# Patient Record
Sex: Female | Born: 1990
Health system: Southern US, Community
[De-identification: ages and names within clinical notes are randomized; demographics above are authoritative.]

## PROBLEM LIST (undated history)

## (undated) DIAGNOSIS — J45909 Unspecified asthma, uncomplicated: Secondary | ICD-10-CM

## (undated) DIAGNOSIS — N809 Endometriosis, unspecified: Secondary | ICD-10-CM

## (undated) DIAGNOSIS — D649 Anemia, unspecified: Secondary | ICD-10-CM

---

## 2018-04-08 ENCOUNTER — Other Ambulatory Visit: Payer: Self-pay

## 2018-04-08 ENCOUNTER — Inpatient Hospital Stay (HOSPITAL_BASED_OUTPATIENT_CLINIC_OR_DEPARTMENT_OTHER)
Admission: AD | Admit: 2018-04-08 | Discharge: 2018-04-10 | DRG: 759 | Disposition: A | Discharge status: Home / Self Care | Payer: Self-pay | Source: Ambulatory Visit | Attending: Obstetrics and Gynecology | Admitting: Obstetrics and Gynecology

## 2018-04-08 ENCOUNTER — Emergency Department (HOSPITAL_BASED_OUTPATIENT_CLINIC_OR_DEPARTMENT_OTHER): Payer: Self-pay

## 2018-04-08 ENCOUNTER — Other Ambulatory Visit: Payer: Self-pay | Admitting: Obstetrics and Gynecology

## 2018-04-08 ENCOUNTER — Encounter (HOSPITAL_BASED_OUTPATIENT_CLINIC_OR_DEPARTMENT_OTHER): Payer: Self-pay

## 2018-04-08 DIAGNOSIS — A568 Sexually transmitted chlamydial infection of other sites: Secondary | ICD-10-CM | POA: Diagnosis present

## 2018-04-08 DIAGNOSIS — N730 Acute parametritis and pelvic cellulitis: Secondary | ICD-10-CM

## 2018-04-08 DIAGNOSIS — A749 Chlamydial infection, unspecified: Secondary | ICD-10-CM | POA: Diagnosis present

## 2018-04-08 DIAGNOSIS — N7093 Salpingitis and oophoritis, unspecified: Principal | ICD-10-CM | POA: Diagnosis present

## 2018-04-08 DIAGNOSIS — N946 Dysmenorrhea, unspecified: Secondary | ICD-10-CM | POA: Diagnosis present

## 2018-04-08 HISTORY — DX: Unspecified asthma, uncomplicated: J45.909

## 2018-04-08 HISTORY — DX: Endometriosis, unspecified: N80.9

## 2018-04-08 HISTORY — DX: Anemia, unspecified: D64.9

## 2018-04-08 LAB — CBC WITH DIFFERENTIAL/PLATELET
BASOS ABS: 0 10*3/uL (ref 0.0–0.1)
Basophils Relative: 0 %
EOS ABS: 0 10*3/uL (ref 0.0–0.7)
Eosinophils Relative: 0 %
HEMATOCRIT: 28.1 % — AB (ref 36.0–46.0)
Hemoglobin: 8.5 g/dL — ABNORMAL LOW (ref 12.0–15.0)
LYMPHS ABS: 1.2 10*3/uL (ref 0.7–4.0)
Lymphocytes Relative: 12 %
MCH: 22.4 pg — ABNORMAL LOW (ref 26.0–34.0)
MCHC: 30.2 g/dL (ref 30.0–36.0)
MCV: 74.1 fL — ABNORMAL LOW (ref 78.0–100.0)
MONOS PCT: 7 %
Monocytes Absolute: 0.7 10*3/uL (ref 0.1–1.0)
Neutro Abs: 7.7 10*3/uL (ref 1.7–7.7)
Neutrophils Relative %: 81 %
Platelets: 335 10*3/uL (ref 150–400)
RBC: 3.79 MIL/uL — AB (ref 3.87–5.11)
RDW: 17.9 % — AB (ref 11.5–15.5)
WBC: 9.6 10*3/uL (ref 4.0–10.5)

## 2018-04-08 LAB — COMPREHENSIVE METABOLIC PANEL
ALBUMIN: 3.4 g/dL — AB (ref 3.5–5.0)
ALK PHOS: 89 U/L (ref 38–126)
ALT: 19 U/L (ref 14–54)
AST: 24 U/L (ref 15–41)
Anion gap: 11 (ref 5–15)
BILIRUBIN TOTAL: 0.6 mg/dL (ref 0.3–1.2)
BUN: 10 mg/dL (ref 6–20)
CO2: 21 mmol/L — ABNORMAL LOW (ref 22–32)
Calcium: 8.6 mg/dL — ABNORMAL LOW (ref 8.9–10.3)
Chloride: 104 mmol/L (ref 101–111)
Creatinine, Ser: 0.97 mg/dL (ref 0.44–1.00)
GFR calc Af Amer: >60 mL/min (ref >60)
GFR calc non Af Amer: >60 mL/min (ref >60)
GLUCOSE: 113 mg/dL — AB (ref 65–99)
POTASSIUM: 3.6 mmol/L (ref 3.5–5.1)
Sodium: 136 mmol/L (ref 135–145)
TOTAL PROTEIN: 8.6 g/dL — AB (ref 6.5–8.1)

## 2018-04-08 LAB — URINALYSIS, ROUTINE W REFLEX MICROSCOPIC
Glucose, UA: NEGATIVE mg/dL
KETONES UR: NEGATIVE mg/dL
Nitrite: NEGATIVE
PROTEIN: 30 mg/dL — AB
Specific Gravity, Urine: >1.03 — ABNORMAL HIGH (ref 1.005–1.030)
pH: 6 (ref 5.0–8.0)

## 2018-04-08 LAB — URINALYSIS, MICROSCOPIC (REFLEX)

## 2018-04-08 LAB — WET PREP, GENITAL
SPERM: NONE SEEN
TRICH WET PREP: NONE SEEN
Yeast Wet Prep HPF POC: NONE SEEN

## 2018-04-08 LAB — GC/CHLAMYDIA PROBE AMP (~~LOC~~) NOT AT ARMC
Chlamydia: POSITIVE — AB
Neisseria Gonorrhea: NEGATIVE

## 2018-04-08 LAB — PREGNANCY, URINE: PREG TEST UR: NEGATIVE

## 2018-04-08 MED ORDER — PIPERACILLIN-TAZOBACTAM 3.375 G IVPB
3.3750 g | Freq: Three times a day (TID) | INTRAVENOUS | Status: DC
  Administered 2018-04-08 – 2018-04-10 (×7): 3.375 g via INTRAVENOUS
  Filled 2018-04-08 (×8): qty 50

## 2018-04-08 MED ORDER — DOXYCYCLINE HYCLATE 100 MG IV SOLR
INTRAVENOUS | Status: AC
  Filled 2018-04-08: qty 100

## 2018-04-08 MED ORDER — SODIUM CHLORIDE 0.9 % IV SOLN: 250.0000 mL | INTRAVENOUS | Status: DC | PRN

## 2018-04-08 MED ORDER — SODIUM CHLORIDE 0.9 % IV SOLN
100.0000 mg | Freq: Once | INTRAVENOUS | Status: AC
  Administered 2018-04-08: 100 mg via INTRAVENOUS
  Filled 2018-04-08: qty 100

## 2018-04-08 MED ORDER — SODIUM CHLORIDE 0.9 % IV SOLN
1.0000 g | Freq: Once | INTRAVENOUS | Status: DC
  Filled 2018-04-08: qty 10

## 2018-04-08 MED ORDER — PRENATAL MULTIVITAMIN CH: 1.0000 | ORAL_TABLET | Freq: Every day | ORAL | Status: DC

## 2018-04-08 MED ORDER — FENTANYL CITRATE (PF) 100 MCG/2ML IJ SOLN
50.0000 ug | Freq: Once | INTRAMUSCULAR | Status: AC
  Administered 2018-04-08: 50 ug via INTRAVENOUS
  Filled 2018-04-08: qty 2

## 2018-04-08 MED ORDER — ACETAMINOPHEN 500 MG PO TABS
1000.0000 mg | ORAL_TABLET | Freq: Once | ORAL | Status: AC
  Administered 2018-04-08: 1000 mg via ORAL
  Filled 2018-04-08: qty 2

## 2018-04-08 MED ORDER — FENTANYL CITRATE (PF) 100 MCG/2ML IJ SOLN
50.0000 ug | Freq: Once | INTRAMUSCULAR | Status: AC
Start: 2018-04-08 — End: 2018-04-08
  Administered 2018-04-08: 50 ug via INTRAVENOUS
  Filled 2018-04-08: qty 2

## 2018-04-08 MED ORDER — PROMETHAZINE HCL 25 MG/ML IJ SOLN
25.0000 mg | Freq: Four times a day (QID) | INTRAMUSCULAR | Status: DC | PRN
  Administered 2018-04-08 (×2): 25 mg via INTRAVENOUS
  Filled 2018-04-08 (×2): qty 1

## 2018-04-08 MED ORDER — DOXYCYCLINE HYCLATE 100 MG PO TABS: 100.0000 mg | ORAL_TABLET | Freq: Once | ORAL | Status: DC

## 2018-04-08 MED ORDER — METRONIDAZOLE IN NACL 5-0.79 MG/ML-% IV SOLN
500.0000 mg | Freq: Once | INTRAVENOUS | Status: AC
  Administered 2018-04-08: 500 mg via INTRAVENOUS
  Filled 2018-04-08: qty 100

## 2018-04-08 MED ORDER — SODIUM CHLORIDE 0.9 % IV SOLN
2.0000 g | Freq: Once | INTRAVENOUS | Status: AC
  Administered 2018-04-08: 2 g via INTRAVENOUS
  Filled 2018-04-08: qty 2

## 2018-04-08 MED ORDER — METRONIDAZOLE 500 MG PO TABS
500.0000 mg | ORAL_TABLET | Freq: Two times a day (BID) | ORAL | Status: DC
  Administered 2018-04-09 (×2): 500 mg via ORAL
  Filled 2018-04-08 (×3): qty 1

## 2018-04-08 MED ORDER — ONDANSETRON HCL 4 MG/2ML IJ SOLN
4.0000 mg | Freq: Once | INTRAMUSCULAR | Status: AC
  Administered 2018-04-08: 4 mg via INTRAVENOUS
  Filled 2018-04-08: qty 2

## 2018-04-08 MED ORDER — SODIUM CHLORIDE 0.9% FLUSH: 3.0000 mL | INTRAVENOUS | Status: DC | PRN

## 2018-04-08 MED ORDER — IBUPROFEN 600 MG PO TABS: 600.0000 mg | ORAL_TABLET | Freq: Four times a day (QID) | ORAL | Status: DC | PRN

## 2018-04-08 MED ORDER — SODIUM CHLORIDE 0.9% FLUSH
3.0000 mL | Freq: Two times a day (BID) | INTRAVENOUS | Status: DC
  Administered 2018-04-09: 3 mL via INTRAVENOUS

## 2018-04-08 MED ORDER — IOPAMIDOL (ISOVUE-300) INJECTION 61%
100.0000 mL | Freq: Once | INTRAVENOUS | Status: AC | PRN
  Administered 2018-04-08: 100 mL via INTRAVENOUS

## 2018-04-08 MED ORDER — SODIUM CHLORIDE 0.9 % IV BOLUS
1000.0000 mL | Freq: Once | INTRAVENOUS | Status: AC
  Administered 2018-04-08: 1000 mL via INTRAVENOUS

## 2018-04-08 MED ORDER — ONDANSETRON 4 MG PO TBDP
4.0000 mg | ORAL_TABLET | Freq: Four times a day (QID) | ORAL | Status: DC | PRN
  Filled 2018-04-08: qty 1

## 2018-04-08 MED ORDER — CEFTRIAXONE SODIUM 250 MG IJ SOLR: 250.0000 mg | Freq: Once | INTRAMUSCULAR | Status: DC

## 2018-04-08 MED ORDER — SODIUM CHLORIDE 0.9 % IV SOLN
INTRAVENOUS | Status: DC
  Administered 2018-04-08: 125 mL/h via INTRAVENOUS
  Administered 2018-04-09: 18:00:00 via INTRAVENOUS
  Administered 2018-04-10: 125 mL/h via INTRAVENOUS

## 2018-04-08 MED ORDER — ONDANSETRON HCL 4 MG/2ML IJ SOLN
4.0000 mg | Freq: Four times a day (QID) | INTRAMUSCULAR | Status: DC | PRN
  Administered 2018-04-08 – 2018-04-09 (×3): 4 mg via INTRAVENOUS
  Filled 2018-04-08 (×3): qty 2

## 2018-04-08 MED ORDER — FENTANYL CITRATE (PF) 100 MCG/2ML IJ SOLN
50.0000 ug | INTRAMUSCULAR | Status: DC | PRN
  Administered 2018-04-08 – 2018-04-09 (×2): 50 ug via INTRAVENOUS
  Filled 2018-04-08 (×2): qty 2

## 2018-04-08 MED ORDER — HYDROCODONE-ACETAMINOPHEN 5-325 MG PO TABS
1.0000 | ORAL_TABLET | ORAL | Status: DC | PRN
  Administered 2018-04-09: 2 via ORAL
  Filled 2018-04-08: qty 2

## 2018-04-08 NOTE — ED Triage Notes (Signed)
Pt was seen at ED in [place] and dx'd with UTI, states she has had worsening right flank pain with fever despite taking her keflex as prescribed, pt has not taken anything for fever or pain today

## 2018-04-08 NOTE — ED Provider Notes (Addendum)
TIME SEEN: 1:07 AM  CHIEF COMPLAINT: Abdominal pain  HPI: Patient is a 27 year old female with history of endometriosis, anemia who presents to the emergency department with complaints of diffuse abdominal pain that has been ongoing for 3 days.  Was seen at an emergency department in [place] and diagnosed with urinary tract infection and started on Keflex.  States pain has progressively gotten worse and she has had fever.  Last menstrual period was the end of April.  No history of STDs or pregnancies.  No nausea, vomiting or diarrhea.  No history of abdominal surgery.  Reports abnormal vaginal bleeding and discharge recently as well as hematuria.  ROS: See HPI Constitutional:  fever  Eyes: no drainage  ENT: no runny nose   Cardiovascular:  no chest pain  Resp: no SOB  GI: no vomiting GU: no dysuria Integumentary: no rash  Allergy: no hives  Musculoskeletal: no leg swelling  Neurological: no slurred speech ROS otherwise negative  PAST MEDICAL HISTORY/PAST SURGICAL HISTORY:  Past Medical History:  Diagnosis Date  . Anemia   . Asthma   . Endometriosis     MEDICATIONS:  Prior to Admission medications   Not on File    ALLERGIES:  No Known Allergies  SOCIAL HISTORY:  Social History   Tobacco Use  . Smoking status: Not on file  Substance Use Topics  . Alcohol use: Not on file    FAMILY HISTORY: No family history on file.  EXAM: BP 102/62 (BP Location: Right Arm)   Pulse 95   Temp 100.2 F (37.9 C) (Oral)   Resp 18   Ht  (1.651 m)   Wt 62.1 kg (137 lb)   LMP 03/19/2018   SpO2 100%   BMI 22.80 kg/m  CONSTITUTIONAL: Alert and oriented and responds appropriately to questions.  Appears uncomfortable, nontoxic-appearing HEAD: Normocephalic EYES: Conjunctivae clear, pupils appear equal, EOMI ENT: normal nose; moist mucous membranes NECK: Supple, no meningismus, no nuchal rigidity, no LAD  CARD: RRR; S1 and S2 appreciated; no murmurs, no clicks, no rubs, no  gallops RESP: Normal chest excursion without splinting or tachypnea; breath sounds clear and equal bilaterally; no wheezes, no rhonchi, no rales, no hypoxia or respiratory distress, speaking full sentences ABD/GI: Normal bowel sounds; non-distended; soft, used to tender throughout the abdomen, no rebound, no guarding, no peritoneal signs, no hepatosplenomegaly GU:  Normal external genitalia. No lesions, rashes noted. Patient has no vaginal bleeding on exam.  Large amount of thin yellow vaginal discharge coming from the cervical os and in the posterior vault.  Mild amount of left adnexal tenderness without fullness or mass.  No right adnexal tenderness, mass or fullness, no cervical motion tenderness. Cervix is not appear friable.  Cervix is closed.  Chaperone present for exam. BACK:  The back appears normal and is non-tender to palpation, there is no CVA tenderness EXT: Normal ROM in all joints; non-tender to palpation; no edema; normal capillary refill; no cyanosis, no calf tenderness or swelling    SKIN: Normal color for age and race; warm; no rash NEURO: Moves all extremities equally PSYCH: The patient's mood and manner are appropriate. Grooming and personal hygiene are appropriate.  MEDICAL DECISION MAKING: Patient here with fever, diffuse abdominal pain.  Differential is large including appendicitis, cholecystitis, pancreatitis, bowel obstruction, colitis, UTI, pyelonephritis, PID, TOA.  Pelvic exam performed which shows large amount of yellow discharge some mild left adnexal tenderness but no cervical motion tenderness.  Labs, urine, pelvic cultures, CT of her abdomen  pelvis pending.  Will treat symptoms with fentanyl, Zofran.  ED PROGRESS: CT scan concerning for PID with bilateral tubo-ovarian abscesses.  She does not have a local OB/GYN.  Will treat with IV cefoxitin, doxycycline and Flagyl.  I will discuss with OB/GYN on-call.  With partner out of the room, patient does report that she was  last sexually active with a female partner approximately 2 months ago.  Currently only sexually active with a female partner.  Again denies history of STDs.   4:17 AM  D/w Dr. Jolayne Panther at [facility].  She agrees with transport to [facility] for admission to the third floor.  She will place admission orders.  Appreciate her help. Patient and partner have been updated with this plan.  We will keep her n.p.o. at this time.   I reviewed all nursing notes, vitals, pertinent previous records, EKGs, lab and urine results, imaging (as available).        Ward, Layla Maw, DO 04/08/18 0420    Ward, Layla Maw, DO 04/08/18 4098

## 2018-04-08 NOTE — ED Notes (Signed)
Called [facility] (kim) for patient transport.

## 2018-04-08 NOTE — H&P (Signed)
Samantha Fowler is an 27 y.o. female G0 with LMP 03/19/2018 transferred from [facility] for inpatient management of bilateral TOA. Patient reports being diagnosed a UTI while in [place] and started a course of antibiotics. Her symptoms never improved. She reports worsening abdominal pain over the past 3 days. She reports fevers at home. She reports excessive vaginal discharge. She is currently sexually active in a same sex relationship. She had intercourse with a female partner 2 months ago. She is not currently on any birth control. She reports a history of endometriosis previously managed with contraception. She states that she is ready to get started on a birth control which suppresses her menses in the hopes of resolving her monthly dysmenorrhea.  Pertinent Gynecological History: Menses: regular every month without intermenstrual spotting Contraception: none DES exposure: denies Blood transfusions: none Sexually transmitted diseases: no past history Previous GYN Procedures: n/a  OB History: G0, P0   Menstrual History: Patient's last menstrual period was 03/19/2018.    Past Medical History:  Diagnosis Date  . Anemia   . Asthma   . Endometriosis     History reviewed. No pertinent surgical history.  No family history on file.  Social History:  has no tobacco, alcohol, and drug history on file.  Allergies: No Known Allergies  No medications prior to admission.    ROS  See pertinent in HPI  Blood pressure (!) 107/59, pulse 100, temperature 97.9 F (36.6 C), temperature source Oral, resp. rate 20, height  (1.651 m), weight 137 lb (62.1 kg), last menstrual period 03/19/2018, SpO2 100 %. Physical Exam GENERAL: Well-developed, well-nourished female in no acute distress.  HEENT: Normocephalic, atraumatic. Sclerae anicteric.  NECK: Supple. Normal thyroid.  LUNGS: Clear to auscultation bilaterally.  HEART: Regular rate and rhythm. ABDOMEN: Soft, diffusely tender,  nondistended.  PELVIC: Normal external female genitalia. Vagina is pink and rugated.  Copious amount of creamy discharge. Normal appearing cervix. Uterus is normal in size. Diffuse lower abdominal tenderness EXTREMITIES: No cyanosis, clubbing, or edema, 2+ distal pulses.  Results for orders placed or performed during the hospital encounter of 04/08/18 (from the past 24 hour(s))  Pregnancy, urine     Status: None   Collection Time: 04/08/18 12:59 AM  Result Value Ref Range   Preg Test, Ur NEGATIVE NEGATIVE  Urinalysis, Routine w reflex microscopic     Status: Abnormal   Collection Time: 04/08/18 12:59 AM  Result Value Ref Range   Color, Urine YELLOW YELLOW   APPearance CLOUDY (A) CLEAR   Specific Gravity, Urine >1.030 (H) 1.005 - 1.030   pH 6.0 5.0 - 8.0   Glucose, UA NEGATIVE NEGATIVE mg/dL   Hgb urine dipstick TRACE (A) NEGATIVE   Bilirubin Urine SMALL (A) NEGATIVE   Ketones, ur NEGATIVE NEGATIVE mg/dL   Protein, ur 30 (A) NEGATIVE mg/dL   Nitrite NEGATIVE NEGATIVE   Leukocytes, UA TRACE (A) NEGATIVE  Urinalysis, Microscopic (reflex)     Status: Abnormal   Collection Time: 04/08/18 12:59 AM  Result Value Ref Range   RBC / HPF 0-5 0 - 5 RBC/hpf   WBC, UA 21-50 0 - 5 WBC/hpf   Bacteria, UA FEW (A) NONE SEEN   Squamous Epithelial / LPF 0-5 0 - 5   Non Squamous Epithelial PRESENT (A) NONE SEEN   Mucus PRESENT   CBC with Differential     Status: Abnormal   Collection Time: 04/08/18  1:10 AM  Result Value Ref Range   WBC 9.6 4.0 -  10.5 K/uL   RBC 3.79 (L) 3.87 - 5.11 MIL/uL   Hemoglobin 8.5 (L) 12.0 - 15.0 g/dL   HCT 16.1 (L) 09.6 - 04.5 %   MCV 74.1 (L) 78.0 - 100.0 fL   MCH 22.4 (L) 26.0 - 34.0 pg   MCHC 30.2 30.0 - 36.0 g/dL   RDW 40.9 (H) 81.1 - 91.4 %   Platelets 335 150 - 400 K/uL   Neutrophils Relative % 81 %   Lymphocytes Relative 12 %   Monocytes Relative 7 %   Eosinophils Relative 0 %   Basophils Relative 0 %   Neutro Abs 7.7 1.7 - 7.7 K/uL   Lymphs Abs 1.2 0.7  - 4.0 K/uL   Monocytes Absolute 0.7 0.1 - 1.0 K/uL   Eosinophils Absolute 0.0 0.0 - 0.7 K/uL   Basophils Absolute 0.0 0.0 - 0.1 K/uL   RBC Morphology ELLIPTOCYTES   Comprehensive metabolic panel     Status: Abnormal   Collection Time: 04/08/18  1:10 AM  Result Value Ref Range   Sodium 136 135 - 145 mmol/L   Potassium 3.6 3.5 - 5.1 mmol/L   Chloride 104 101 - 111 mmol/L   CO2 21 (L) 22 - 32 mmol/L   Glucose, Bld 113 (H) 65 - 99 mg/dL   BUN 10 6 - 20 mg/dL   Creatinine, Ser 7.82 0.44 - 1.00 mg/dL   Calcium 8.6 (L) 8.9 - 10.3 mg/dL   Total Protein 8.6 (H) 6.5 - 8.1 g/dL   Albumin 3.4 (L) 3.5 - 5.0 g/dL   AST 24 15 - 41 U/L   ALT 19 14 - 54 U/L   Alkaline Phosphatase 89 38 - 126 U/L   Total Bilirubin 0.6 0.3 - 1.2 mg/dL   GFR calc non Af Amer >60 >60 mL/min   GFR calc Af Amer >60 >60 mL/min   Anion gap 11 5 - 15  Wet prep, genital     Status: Abnormal   Collection Time: 04/08/18  2:46 AM  Result Value Ref Range   Yeast Wet Prep HPF POC NONE SEEN NONE SEEN   Trich, Wet Prep NONE SEEN NONE SEEN   Clue Cells Wet Prep HPF POC PRESENT (A) NONE SEEN   WBC, Wet Prep HPF POC MANY (A) NONE SEEN   Sperm NONE SEEN     Ct Abdomen Pelvis W Contrast  Result Date: 04/08/2018 CLINICAL DATA:  27 year old female with right flank pain. Diagnosed with UTI and started antibiotic with no symptom relief. EXAM: CT ABDOMEN AND PELVIS WITH CONTRAST TECHNIQUE: Multidetector CT imaging of the abdomen and pelvis was performed using the standard protocol following bolus administration of intravenous contrast. CONTRAST:  ISOVUE-300 IOPAMIDOL (ISOVUE-300) INJECTION 61% COMPARISON:  None. FINDINGS: Lower chest: The visualized lung bases are clear. No intra-abdominal free air. There is diffuse mesenteric edema and small amount of free fluid within the pelvis. Hepatobiliary: No focal liver abnormality is seen. Small noncalcified stone or sludge may be present within the gallbladder. No pericholecystic fluid. No  dilatation of the biliary ducts. Pancreas: Unremarkable. No pancreatic ductal dilatation or surrounding inflammatory changes. Spleen: Normal in size without focal abnormality. Adrenals/Urinary Tract: Adrenal glands are unremarkable. Kidneys are normal, without renal calculi, focal lesion, or hydronephrosis. Bladder is unremarkable. Stomach/Bowel: There is inflammatory changes and thickening of multiple loops of small bowel in the mid to lower ab. There is dilatation of the loop of bowel proximal to this segment which measures up to 3.7 cm. Oral contrast is  seen in the proximal small bowel loops. The colon is unremarkable. The appendix is normal. Vascular/Lymphatic: No significant vascular findings are present. No enlarged abdominal or pelvic lymph nodes. Reproductive: The uterus is anteverted and grossly unremarkable. There are bilateral adnexal dilated tubal structures with complex content measuring up to 6 x 3 cm on the right. There is diffuse inflammatory changes and edema of the pelvic fat. Other: None Musculoskeletal: No acute or significant osseous findings. IMPRESSION: 1. Inflammatory changes of the pelvis with complex dilated tubular structures within the adnexa. Findings suspicious for pelvic inflammatory disease and possible tubo-ovarian abscess. Correlation with clinical exam and further evaluation with pelvic ultrasound recommended. 2. Inflammatory changes of the mid small bowel loops with associated ileus. This inflammatory changes may be secondary to inflammation of the pelvis. Enteritis as the primary cause of the inflammatory changes within the abdomen pelvis is favored less likely but not entirely excluded clinical correlation is recommended. The appendix is normal. Electronically Signed   By: Elgie Collard M.D.   On: 04/08/2018 03:55    Assessment/Plan: 27 yo G0 with bilateral TOA - IV antibiotics - pain management prn - Consider consult with interventional radiology  Jennilyn Esteve 04/08/2018, 6:02 AM

## 2018-04-09 DIAGNOSIS — A749 Chlamydial infection, unspecified: Secondary | ICD-10-CM | POA: Diagnosis present

## 2018-04-09 LAB — CBC WITH DIFFERENTIAL/PLATELET
BASOS ABS: 0 10*3/uL (ref 0.0–0.1)
Basophils Relative: 0 %
EOS ABS: 0 10*3/uL (ref 0.0–0.7)
Eosinophils Relative: 0 %
HCT: 27.6 % — ABNORMAL LOW (ref 36.0–46.0)
Hemoglobin: 7.9 g/dL — ABNORMAL LOW (ref 12.0–15.0)
LYMPHS ABS: 1.8 10*3/uL (ref 0.7–4.0)
Lymphocytes Relative: 19 %
MCH: 22.1 pg — AB (ref 26.0–34.0)
MCHC: 28.6 g/dL — ABNORMAL LOW (ref 30.0–36.0)
MCV: 77.1 fL — ABNORMAL LOW (ref 78.0–100.0)
MONO ABS: 0.2 10*3/uL (ref 0.1–1.0)
MONOS PCT: 2 %
NEUTROS ABS: 7.3 10*3/uL (ref 1.7–7.7)
NEUTROS PCT: 79 %
Platelets: 349 10*3/uL (ref 150–400)
RBC: 3.58 MIL/uL — AB (ref 3.87–5.11)
RDW: 18.2 % — ABNORMAL HIGH (ref 11.5–15.5)
WBC: 9.3 10*3/uL (ref 4.0–10.5)

## 2018-04-09 LAB — URINE CULTURE: CULTURE: NO GROWTH

## 2018-04-09 LAB — HEPATITIS B SURFACE ANTIGEN: HEP B S AG: NEGATIVE

## 2018-04-09 MED ORDER — AZITHROMYCIN 250 MG PO TABS
1000.0000 mg | ORAL_TABLET | Freq: Once | ORAL | Status: AC
  Administered 2018-04-09: 1000 mg via ORAL
  Filled 2018-04-09: qty 4

## 2018-04-09 NOTE — Progress Notes (Signed)
[facility]ulty [facility] OB/GYN Attending Note  Subjective:  Patient reports reduced abdominal pain, reduced nausea. Able to tolerate some food. No other symptoms.  Admitted on 04/08/2018 for TOA (tubo-ovarian abscess).    Objective:  Blood pressure 111/61, pulse (!) 50, temperature 98.4 F (36.9 C), resp. rate 16, height '5\' 5"'  (1.651 m), weight 137 lb (62.1 kg), last menstrual period 03/19/2018, SpO2 100 %. Patient Vitals for the past 24 hrs:  BP Temp Temp src Pulse Resp SpO2  04/09/18 0404 111/61 98.4 F (36.9 C) - (!) 50 16 100 %  04/09/18 0000 114/65 98 F (36.7 C) - (!) 54 18 100 %  04/08/18 1956 120/66 98.8 F (37.1 C) - (!) 57 16 100 %  04/08/18 1550 114/63 99.4 F (37.4 C) Oral 65 16 100 %  04/08/18 1146 (!) 100/57 - - (!) 48 - -  04/08/18 1132 (!) 73/56 98.5 F (36.9 C) Oral (!) 53 16 100 %   Gen: NAD HENT: Normocephalic, atraumatic Lungs: Normal respiratory effort Heart: Regular rate noted Abdomen: soft, mild TTP in lower abdomen Cervix: Deferred Ext: 2+ DTRs, no edema, no cyanosis, negative Homan's sign  Results for orders placed or performed during the hospital encounter of 04/08/18 (from the past 72 hour(s))  GC/Chlamydia probe amp ([place])not at [facility]     Status: Abnormal   Collection Time: 04/08/18 12:00 AM  Result Value Ref Range   Chlamydia **POSITIVE** (A)     Comment: Normal Reference Range - Negative   Neisseria gonorrhea Negative     Comment: Normal Reference Range - Negative  Pregnancy, urine     Status: None   Collection Time: 04/08/18 12:59 AM  Result Value Ref Range   Preg Test, Ur NEGATIVE NEGATIVE    Comment:        THE SENSITIVITY OF THIS METHODOLOGY IS >20 mIU/mL. Performed at [facility], [address] [place], [place] [address]   Urinalysis, Routine w reflex microscopic     Status: Abnormal   Collection Time: 04/08/18 12:59 AM  Result Value Ref Range   Color, Urine YELLOW YELLOW   APPearance CLOUDY (A) CLEAR   Specific  Gravity, Urine >1.030 (H) 1.005 - 1.030   pH 6.0 5.0 - 8.0   Glucose, UA NEGATIVE NEGATIVE mg/dL   Hgb urine dipstick TRACE (A) NEGATIVE   Bilirubin Urine SMALL (A) NEGATIVE   Ketones, ur NEGATIVE NEGATIVE mg/dL   Protein, ur 30 (A) NEGATIVE mg/dL   Nitrite NEGATIVE NEGATIVE   Leukocytes, UA TRACE (A) NEGATIVE    Comment: Performed at [facility], [address] [place], [place] [address]  Urine culture     Status: None   Collection Time: 04/08/18 12:59 AM  Result Value Ref Range   Specimen Description      URINE, RANDOM Performed at [facility], [address] [place], [place] [address]    Special Requests      NONE Performed at [facility], [address] [place], [place] [address]    Culture      NO GROWTH Performed at [facility], [place] [address], [place], [place] [address]    Report Status 04/09/2018 FINAL   Urinalysis, Microscopic (reflex)     Status: Abnormal   Collection Time: 04/08/18 12:59 AM  Result Value Ref Range   RBC / HPF 0-5 0 - 5 RBC/hpf   WBC, UA 21-50 0 - 5 WBC/hpf  Bacteria, UA FEW (A) NONE SEEN   Squamous Epithelial / LPF 0-5 0 - 5   Non Squamous Epithelial PRESENT (A) NONE SEEN   Mucus PRESENT     Comment: Performed at [facility], [address] [place], [place] [address]  CBC with Differential     Status: Abnormal   Collection Time: 04/08/18  1:10 AM  Result Value Ref Range   WBC 9.6 4.0 - 10.5 K/uL   RBC 3.79 (L) 3.87 - 5.11 MIL/uL   Hemoglobin 8.5 (L) 12.0 - 15.0 g/dL   HCT 28.1 (L) 36.0 - 46.0 %   MCV 74.1 (L) 78.0 - 100.0 fL   MCH 22.4 (L) 26.0 - 34.0 pg   MCHC 30.2 30.0 - 36.0 g/dL   RDW 17.9 (H) 11.5 - 15.5 %   Platelets 335 150 - 400 K/uL   Neutrophils Relative % 81 %   Lymphocytes Relative 12 %   Monocytes Relative 7 %   Eosinophils Relative 0 %   Basophils Relative 0 %   Neutro Abs 7.7 1.7 - 7.7 K/uL   Lymphs Abs 1.2 0.7 - 4.0 K/uL   Monocytes Absolute 0.7 0.1 - 1.0 K/uL    Eosinophils Absolute 0.0 0.0 - 0.7 K/uL   Basophils Absolute 0.0 0.0 - 0.1 K/uL   RBC Morphology ELLIPTOCYTES     Comment: POLYCHROMASIA PRESENT Performed at [facility], [address] [place], [place] [address]   Comprehensive metabolic panel     Status: Abnormal   Collection Time: 04/08/18  1:10 AM  Result Value Ref Range   Sodium 136 135 - 145 mmol/L   Potassium 3.6 3.5 - 5.1 mmol/L   Chloride 104 101 - 111 mmol/L   CO2 21 (L) 22 - 32 mmol/L   Glucose, Bld 113 (H) 65 - 99 mg/dL   BUN 10 6 - 20 mg/dL   Creatinine, Ser 0.97 0.44 - 1.00 mg/dL   Calcium 8.6 (L) 8.9 - 10.3 mg/dL   Total Protein 8.6 (H) 6.5 - 8.1 g/dL   Albumin 3.4 (L) 3.5 - 5.0 g/dL   AST 24 15 - 41 U/L   ALT 19 14 - 54 U/L   Alkaline Phosphatase 89 38 - 126 U/L   Total Bilirubin 0.6 0.3 - 1.2 mg/dL   GFR calc non Af Amer >60 >60 mL/min   GFR calc Af Amer >60 >60 mL/min    Comment: (NOTE) The eGFR has been calculated using the CKD EPI equation. This calculation has not been validated in all clinical situations. eGFR's persistently <60 mL/min signify possible Chronic Kidney Disease.    Anion gap 11 5 - 15    Comment: Performed at [facility], [address] [place], [place] [address]  Wet prep, genital     Status: Abnormal   Collection Time: 04/08/18  2:46 AM  Result Value Ref Range   Yeast Wet Prep HPF POC NONE SEEN NONE SEEN   Trich, Wet Prep NONE SEEN NONE SEEN   Clue Cells Wet Prep HPF POC PRESENT (A) NONE SEEN   WBC, Wet Prep HPF POC MANY (A) NONE SEEN   Sperm NONE SEEN     Comment: Performed at [facility], [address] [place], [place] [address]    Assessment & Plan:  Principal Problem:   TOA (tubo-ovarian abscess) Active Problems:   Chlamydia  Positive chlamydia result discussed with patient.  Recommended testing for other STIs (  this was ordered), also needs to let partner(s) know so the partner(s) can get testing and treatment. Patient and sex  partner(s) should abstain from unprotected sexual activity for seven days after everyone receives appropriate treatment.  Azithromycin 1000 mg po x 1 was prescribed for patient.  Patient will need to to be tested after 4 weeks after treatment for test of cure. Continue Zosyn for TOA treatment; will have this for at least 48 hours Consider discharge to home tomorrow on oral regimen if remain stable and afebrile, and if pain is still under control Routine floor care   Verita Schneiders, MD, [place], [facility] for [place]

## 2018-04-10 ENCOUNTER — Encounter (HOSPITAL_COMMUNITY): Payer: Self-pay | Admitting: Obstetrics and Gynecology

## 2018-04-10 DIAGNOSIS — N730 Acute parametritis and pelvic cellulitis: Secondary | ICD-10-CM | POA: Diagnosis present

## 2018-04-10 DIAGNOSIS — N7093 Salpingitis and oophoritis, unspecified: Principal | ICD-10-CM

## 2018-04-10 LAB — RPR: RPR Ser Ql: NONREACTIVE

## 2018-04-10 LAB — HIV ANTIBODY (ROUTINE TESTING W REFLEX): HIV Screen 4th Generation wRfx: NONREACTIVE

## 2018-04-10 LAB — HEPATITIS C ANTIBODY (REFLEX): HCV Ab: <0.1 s/co ratio (ref 0.0–0.9)

## 2018-04-10 LAB — HCV COMMENT:

## 2018-04-10 MED ORDER — IBUPROFEN 600 MG PO TABS: 600.0000 mg | ORAL_TABLET | Freq: Four times a day (QID) | ORAL | 0 refills | Status: DC | PRN

## 2018-04-10 MED ORDER — METRONIDAZOLE 500 MG PO TABS: 500.0000 mg | ORAL_TABLET | Freq: Two times a day (BID) | ORAL | 0 refills | Status: DC

## 2018-04-10 MED ORDER — ONDANSETRON 4 MG PO TBDP: 4.0000 mg | ORAL_TABLET | Freq: Three times a day (TID) | ORAL | 0 refills | Status: DC | PRN

## 2018-04-10 MED ORDER — DOXYCYCLINE HYCLATE 100 MG PO CAPS: 100.0000 mg | ORAL_CAPSULE | Freq: Two times a day (BID) | ORAL | 0 refills | Status: DC

## 2018-04-10 NOTE — Discharge Summary (Signed)
Physician Discharge Summary  Patient ID: Samantha Fowler MRN: [id number] DOB/AGE: [birth year] 26 y.o.  Admit date: 04/08/2018 Discharge date: 04/10/2018  Admission Diagnoses: tubo-ovarian abscess  Discharge Diagnoses:  Principal Problem:   TOA (tubo-ovarian abscess) Active Problems:   Chlamydia   PID (acute pelvic inflammatory disease)   Discharged Condition: good  Hospital Course: Please see HPI dated 04/08/2018 for full details. Briefly, this is a 27 y.o. G0 female admitted for tubo-ovarian abscess. She received 48 hours of zosyn and PO flagyl. She was never febrile. Genital cultures positive for chlamydia in hospital for which she was treated and counseled about partners needing treatment. By 48 hours of IV antibiotics, she was feeling much much better and desiring to go home. Very minimal tenderness on exam. She was discharged home HD#3 in good condition.  I reviewed importance of getting treatment for partners and refraining from sexual activity until all partners have been treated. I also reviewed the importance of compliance with antibiotics, importance of taking full course and need to return to hospital with any worsening issues, patient verbalizes understanding. She will f/u in office in 2 weeks.  Medical history significant for n/a  Physical exam  Vitals:   04/09/18 1941 04/09/18 2343 04/10/18 0520 04/10/18 0727  BP: (!) 116/55 (!) 92/45 (!) 104/55   Pulse: (!) 55 (!) 54 (!) 46 (!) 49  Resp: Temp: 98.6 F (37 C) 98.6 F (37 C) 98.9 F (37.2 C) 98.2 F (36.8 C)  TempSrc: Oral Oral Oral Oral  SpO2: 100% 100% 100% 100%  Weight:      Height:       BP (!) 104/55 (BP Location: Left Arm)   Pulse (!) 49   Temp 98.2 F (36.8 C) (Oral)   Resp 16   Ht  (1.651 m)   Wt 137 lb (62.1 kg)   LMP 03/19/2018   SpO2 100%   BMI 22.80 kg/m  CONSTITUTIONAL: Well-developed, well-nourished female in no acute distress.  SKIN: Skin is warm and dry. No rash noted. Not  diaphoretic. No erythema. No pallor. NEUROLOGIC: Alert and oriented to person, place, and time. Normal reflexes, muscle tone coordination. No cranial nerve deficit noted. PSYCHIATRIC: Normal mood and affect. Normal behavior. Normal judgment and thought content. CARDIOVASCULAR: Normal heart rate noted, regular rhythm RESPIRATORY: Clear to auscultation bilaterally. Effort and breath sounds normal, no problems with respiration noted. ABDOMEN: Soft, normal bowel sounds, no distention noted.  No tenderness, rebound or guarding.  PELVIC: deferred MUSCULOSKELETAL: Normal range of motion. No tenderness.  No cyanosis, clubbing, or edema.    Labs: Lab Results  Component Value Date   WBC 9.3 04/09/2018   HGB 7.9 (L) 04/09/2018   HCT 27.6 (L) 04/09/2018   MCV 77.1 (L) 04/09/2018   PLT 349 04/09/2018   CMP Latest Ref Rng & Units 04/08/2018  Glucose 65 - 99 mg/dL 578(I)  BUN 6 - 20 mg/dL 10  Creatinine 6.96 - 2.95 mg/dL 2.84  Sodium 132 - 440 mmol/L 136  Potassium 3.5 - 5.1 mmol/L 3.6  Chloride 101 - 111 mmol/L 104  CO2 22 - 32 mmol/L 21(L)  Calcium 8.9 - 10.3 mg/dL 1.0(U)  Total Protein 6.5 - 8.1 g/dL 7.2(Z)  Total Bilirubin 0.3 - 1.2 mg/dL 0.6  Alkaline Phos 38 - 126 U/L 89  AST 15 - 41 U/L 24  ALT 14 - 54 U/L 19      Disposition: Discharge disposition: 01-Home or Self Care  Discharge Instructions    Call MD for:  difficulty breathing, headache or visual disturbances   Complete by:  As directed    Call MD for:  persistant nausea and vomiting   Complete by:  As directed    Call MD for:  redness, tenderness, or signs of infection (pain, swelling, redness, odor or green/yellow discharge around incision site)   Complete by:  As directed    Call MD for:  severe uncontrolled pain   Complete by:  As directed    Call MD for:  temperature >100.4   Complete by:  As directed    Diet - low sodium heart healthy   Complete by:  As directed    Increase activity slowly   Complete by:   As directed      An After Visit Summary was printed and given to the patient. Allergies as of 04/10/2018   No Known Allergies     Medication List    STOP taking these medications   cephALEXin 500 MG capsule Commonly known as:  KEFLEX     TAKE these medications   doxycycline 100 MG capsule Commonly known as:  VIBRAMYCIN Take 1 capsule (100 mg total) by mouth 2 (two) times daily.   ibuprofen 600 MG tablet Commonly known as:  ADVIL,MOTRIN Take 1 tablet (600 mg total) by mouth every 6 (six) hours as needed (mild pain).   metroNIDAZOLE 500 MG tablet Commonly known as:  FLAGYL Take 1 tablet (500 mg total) by mouth 2 (two) times daily.   ondansetron 4 MG disintegrating tablet Commonly known as:  ZOFRAN ODT Take 1 tablet (4 mg total) by mouth every 8 (eight) hours as needed for nausea or vomiting.      Follow-up Information    [facility]. Schedule an appointment as soon as possible for a visit in 2 week(s).   Specialty:  Obstetrics and Gynecology Why:  for follow up  Contact information: [address] [place] [address] [phone]          Signed: Conan Bowens 04/10/2018, 7:29 AM

## 2018-04-10 NOTE — Discharge Instructions (Signed)
Pelvic Inflammatory Disease Pelvic inflammatory disease (PID) refers to an infection in some or all of the female organs. The infection can be in the uterus, ovaries, fallopian tubes, or the surrounding tissues in the pelvis. PID can cause abdominal or pelvic pain that comes on suddenly (acute pelvic pain). PID is a serious infection because it can lead to lasting (chronic) pelvic pain or the inability to have children (infertility). What are the causes? This condition is most often caused by an infection that is spread during sexual contact. However, the infection can also be caused by the normal bacteria that are found in the vaginal tissues if these bacteria travel upward into the reproductive organs. PID can also occur following:  The birth of a baby.  A miscarriage.  An abortion.  Major pelvic surgery.  The use of an intrauterine device (IUD).  A sexual assault.  What increases the risk? This condition is more likely to develop in women who:  Are younger than 25 years of age.  Are sexually active at [facility] age.  Use nonbarrier contraception.  Have multiple sexual partners.  Have sex with someone who has symptoms of an STD (sexually transmitted disease).  Use oral contraception.  At times, certain behaviors can also increase the possibility of getting PID, such as:  Using a vaginal douche.  Having an IUD in place.  What are the signs or symptoms? Symptoms of this condition include:  Abdominal or pelvic pain.  Fever.  Chills.  Abnormal vaginal discharge.  Abnormal uterine bleeding.  Unusual pain shortly after the end of a menstrual period.  Painful urination.  Pain with sexual intercourse.  Nausea and vomiting.  How is this diagnosed? To diagnose this condition, your health care provider will do a physical exam and take your medical history. A pelvic exam typically reveals great tenderness in the uterus and the surrounding pelvic tissues. You may  also have tests, such as:  Lab tests, including a pregnancy test, blood tests, and urine test.  Culture tests of the vagina and cervix to check for an STD.  Ultrasound.  A laparoscopic procedure to look inside the pelvis.  Examining vaginal secretions under a microscope.  How is this treated? Treatment for this condition may involve one or more approaches.  Antibiotic medicines may be prescribed to be taken by mouth.  Sexual partners may need to be treated if the infection is caused by an STD.  For more severe cases, hospitalization may be needed to give antibiotics directly into a vein through an IV tube.  Surgery may be needed if other treatments do not help, but this is rare.  It may take weeks until you are completely well. If you are diagnosed with PID, you should also be checked for human immunodeficiency virus (HIV). Your health care provider may test you for infection again 3 months after treatment. You should not have unprotected sex. Follow these instructions at home:  Take over-the-counter and prescription medicines only as told by your health care provider.  If you were prescribed an antibiotic medicine, take it as told by your health care provider. Do not stop taking the antibiotic even if you start to feel better.  Do not have sexual intercourse until treatment is completed or as told by your health care provider. If PID is confirmed, your recent sexual partners will need treatment, especially if you had unprotected sex.  Keep all follow-up visits as told by your health care provider. This is important. Contact a health  care provider if:  You have increased or abnormal vaginal discharge.  Your pain does not improve.  You vomit.  You have a fever.  You cannot tolerate your medicines.  Your partner has an STD.  You have pain when you urinate. Get help right away if:  You have increased abdominal or pelvic pain.  You have chills.  Your symptoms are not  better in 72 hours even with treatment. This information is not intended to replace advice given to you by your health care provider. Make sure you discuss any questions you have with your health care provider. Document Released: 11/17/2005 Document Revised: 04/24/2016 Document Reviewed: 12/25/2014 Elsevier Interactive Patient Education  2018 [employer]. Preventing Sexually Transmitted Infections, Adult Sexually transmitted infections (STIs) are diseases that are passed (transmitted) from person to person through bodily fluids exchanged during sex or sexual contact. Bodily fluids include saliva, semen, blood, vaginal mucus, and urine. You may have an increased risk for developing an STI if you have unprotected oral, vaginal, or anal sex. Some common STIs include:  Herpes.  Hepatitis B.  Chlamydia.  Gonorrhea.  Syphilis.  HPV (human papillomavirus).  HIV (humanimmunodeficiency virus), the virus that can cause AIDS (acquired immunodeficiency virus).  How can I protect myself from sexually transmitted infections? The only way to completely prevent STIs is not to have sex of any kind (practice abstinence). This includes oral, vaginal, or anal sex. If you are sexually active, take these actions to lower your risk of getting an STI:  Have only one sex partner (be monogamous) or limit the number of sexual partners you have.  Stay up-to-date on immunizations. Certain vaccines can lower your risk of getting certain STIs, such as: ? Hepatitis A and B vaccines. You may have been vaccinated as a young child, but likely need a booster shot as a teen or young adult. ? HPV vaccine. This vaccine is recommended if you are a man under age 44 or a woman under age 53.  Use methods that prevent the exchange of body fluids between partners (barrier protection) every time you have sex. Barrier protection can be used during oral, vaginal, or anal sex. Commonly used barrier methods include: ? Female  condom. ? Female condom. ? Dental dam.  Get tested regularly for STIs. Have your sexual partner get tested regularly as well.  Avoid mixing alcohol, drugs, and sex. Alcohol and drug use can affect your ability to make good decisions and can lead to risky sexual behaviors.  Ask your health care provider about taking pre-exposure prophylaxis (PrEP) to prevent HIV infection if you: ? Have a HIV-positive sexual partner. ? Have multiple sexual partners or partners who do not know their HIV status, and do not regularly use a condom during sex. ? Use injection drugs and share needles.  Birth control pills, injections, implants, and intrauterine devices (IUDs) do not protect against STIs. To prevent both STIs and pregnancy, always use a condom with another form of birth control. Some STIs, such as herpes, are spread through skin to skin contact. A condom does not protect you from getting such STIs. If you or your partner have herpes and there is an active flare with open sores, avoid all sexual contact. Why are these changes important? Taking steps to practice safe sex protects you and others. Many STIs can be cured. However, some STIs are not curable and will affect you for the rest of your life. STIs can be passed on to another person even if you  do not have symptoms. What can happen if changes are not made? Certain STIs may:  Require you to take medicine for the rest of your life.  Affect your ability to have children (your fertility).  Increase your risk for developing another STI or certain serious health conditions, such as: ? Cervical cancer. ? Head and neck cancer. ? Pelvic inflammatory disease (PID) in women. ? Organ damage or damage to other parts of your body, if the infection spreads.  Be passed to a baby during childbirth.  How are sexually transmitted infections treated? If you or your partner know or think that you may have an STI:  Talk with your healthcare provider about  what can be done to treat it. Some STIs can be treated and cured with medicines.  For curable STIs, you and your partner should avoid sex during treatment and for several days after treatment is complete.  You and your partner should both be treated at the same time, if there is any chance that your partner is infected as well. If you get treatment but your partner does not, your partner can re-infect you when you resume sexual contact.  Do not have unprotected sex.  Where to find more information: Learn more about sexually transmitted diseases and infections from:  Centers for Disease Control and Prevention: ? More information about specific STIs: SolutionApps.co.za ? Find places to get sexual health counseling and treatment for free or for a low cost: gettested.TonerPromos.no  [place]. Department of Health and Human Services: NotebookPreviews.si.html  Summary  The only way to completely prevent STIs is not to have sex (practice abstinence), including oral, vaginal, or anal sex.  STIs can spread through saliva, semen, blood, vaginal mucus, urine, or sexual contact.  If you do have sex, limit your number of sexual partners and use a barrier protection method every time you have sex.  If you develop an STI, get treated right away and ask your partner to be treated as well. Do not resume having sex until both of you have completed treatment for the STI. This information is not intended to replace advice given to you by your health care provider. Make sure you discuss any questions you have with your health care provider. Document Released: 11/13/2016 Document Revised: 11/13/2016 Document Reviewed: 11/13/2016 Elsevier Interactive Patient Education  2018 [employer]. Safe Sex Practicing safe sex means taking steps before and during sex to reduce your risk of:  Getting an STD (sexually transmitted disease).  Giving your  partner an STD.  Unwanted pregnancy.  How can I practice safe sex?  To practice safe sex:  Limit your sexual partners to only one partner who is having sex with only you.  Avoid using alcohol and recreational drugs before having sex. These substances can affect your judgment.  Before having sex with a new partner: ? Talk to your partner about past partners, past STDs, and drug use. ? You and your partner should be screened for STDs and discuss the results with each other.  Check your body regularly for sores, blisters, rashes, or unusual discharge. If you notice any of these problems, visit your health care provider.  If you have symptoms of an infection or you are being treated for an STD, avoid sexual contact.  While having sex, use a condom. Make sure to: ? Use a condom every time you have vaginal, oral, or anal sex. Both females and males should wear condoms during oral sex. ? Keep condoms in place from the  beginning to the end of sexual activity. ? Use a latex condom, if possible. Latex condoms offer the best protection. ? Use only water-based lubricants or oils to lubricate a condom. Using petroleum-based lubricants or oils will weaken the condom and increase the chance that it will break.  See your health care provider for regular screenings, exams, and tests for STDs.  Talk with your health care provider about the form of birth control (contraception) that is best for you.  Get vaccinated against hepatitis B and human papillomavirus (HPV).  If you are at risk of being infected with HIV (human immunodeficiency virus), talk with your health care provider about taking a prescription medicine to prevent HIV infection. You are considered at risk for HIV if: ? You are a man who has sex with other men. ? You are a heterosexual man or woman who is sexually active with more than one partner. ? You take drugs by injection. ? You are sexually active with a partner who has  HIV.  This information is not intended to replace advice given to you by your health care provider. Make sure you discuss any questions you have with your health care provider. Document Released: 12/25/2004 Document Revised: 04/02/2016 Document Reviewed: 10/07/2015 Elsevier Interactive Patient Education  2018 Else[employer] Inc.    Pelvic Inflammatory Disease Pelvic inflammatory disease (PID) refers to an infection in some or all of the female organs. The infection can be in the uterus, ovaries, fallopian tubes, or the surrounding tissues in the pelvis. PID can cause abdominal or pelvic pain that comes on suddenly (acute pelvic pain). PID is a serious infection because it can lead to lasting (chronic) pelvic pain or the inability to have children (infertility). What are the causes? This condition is most often caused by an infection that is spread during sexual contact. However, the infection can also be caused by the normal bacteria that are found in the vaginal tissues if these bacteria travel upward into the reproductive organs. PID can also occur following:  The birth of a baby.  A miscarriage.  An abortion.  Major pelvic surgery.  The use of an intrauterine device (IUD).  A sexual assault.  What increases the risk? This condition is more likely to develop in women who:  Are younger than 25 years of age.  Are sexually active at [facility] age.  Use nonbarrier contraception.  Have multiple sexual partners.  Have sex with someone who has symptoms of an STD (sexually transmitted disease).  Use oral contraception.  At times, certain behaviors can also increase the possibility of getting PID, such as:  Using a vaginal douche.  Having an IUD in place.  What are the signs or symptoms? Symptoms of this condition include:  Abdominal or pelvic pain.  Fever.  Chills.  Abnormal vaginal discharge.  Abnormal uterine bleeding.  Unusual pain shortly after the end of a menstrual  period.  Painful urination.  Pain with sexual intercourse.  Nausea and vomiting.  How is this diagnosed? To diagnose this condition, your health care provider will do a physical exam and take your medical history. A pelvic exam typically reveals great tenderness in the uterus and the surrounding pelvic tissues. You may also have tests, such as:  Lab tests, including a pregnancy test, blood tests, and urine test.  Culture tests of the vagina and cervix to check for an STD.  Ultrasound.  A laparoscopic procedure to look inside the pelvis.  Examining vaginal secretions under a microscope.  How  is this treated? Treatment for this condition may involve one or more approaches.  Antibiotic medicines may be prescribed to be taken by mouth.  Sexual partners may need to be treated if the infection is caused by an STD.  For more severe cases, hospitalization may be needed to give antibiotics directly into a vein through an IV tube.  Surgery may be needed if other treatments do not help, but this is rare.  It may take weeks until you are completely well. If you are diagnosed with PID, you should also be checked for human immunodeficiency virus (HIV). Your health care provider may test you for infection again 3 months after treatment. You should not have unprotected sex. Follow these instructions at home:  Take over-the-counter and prescription medicines only as told by your health care provider.  If you were prescribed an antibiotic medicine, take it as told by your health care provider. Do not stop taking the antibiotic even if you start to feel better.  Do not have sexual intercourse until treatment is completed or as told by your health care provider. If PID is confirmed, your recent sexual partners will need treatment, especially if you had unprotected sex.  Keep all follow-up visits as told by your health care provider. This is important. Contact a health care provider if:  You  have increased or abnormal vaginal discharge.  Your pain does not improve.  You vomit.  You have a fever.  You cannot tolerate your medicines.  Your partner has an STD.  You have pain when you urinate. Get help right away if:  You have increased abdominal or pelvic pain.  You have chills.  Your symptoms are not better in 72 hours even with treatment. This information is not intended to replace advice given to you by your health care provider. Make sure you discuss any questions you have with your health care provider. Document Released: 11/17/2005 Document Revised: 04/24/2016 Document Reviewed: 12/25/2014 Elsevier Interactive Patient Education  [employer].

## 2018-04-10 NOTE — Progress Notes (Signed)

## 2018-04-12 ENCOUNTER — Telehealth: Payer: Self-pay | Admitting: *Deleted

## 2018-04-12 ENCOUNTER — Telehealth: Payer: Self-pay | Admitting: General Practice

## 2018-04-12 MED FILL — DOXYCYCLINE HYCLATE 100 MG: 100 | 14 days supply | Qty: 28 | Fill #0

## 2018-04-12 MED FILL — metroNIDAZOLE 500 MG TABS: 500 | 14 days supply | Qty: 28 | Fill #0

## 2018-04-12 MED FILL — ONDANSETRON ODT 4 MG TABLET: 4 | 5 days supply | Qty: 15 | Fill #0

## 2018-04-12 MED FILL — IBUPROFEN 600 MG TABLET: 600 | 7 days supply | Qty: 30 | Fill #0

## 2018-04-12 NOTE — Care Management (Signed)
Late entry 04/12/18 CM had chart left on the OB High Risk Unit from Marinell Blight RN from the day before for CM to follow up on.   Per Selena Batten, patient went home and was unable to afford her medications.  CM called financial counselor and verified with Burman Foster and patient does not have insurance.  Patient does qualify for the [facility] program.  The prescriptions were sent to [facility] at [address] and they are not in the [facility] program and so the outpatient [facility] pharmacist - Arlys John is going to have them transferred to [facility] outpatient pharmacy.  Patient was given the phone number, address and CM phone number.  Each prescription will be 3$ which should not be a total of over 15$.  At [facility] originally the cost was 205$ per the patient.  Patient to go to [facility] today 04/12/18 and pick up her prescriptions.  No other needs identified.

## 2018-04-12 NOTE — Telephone Encounter (Signed)
Left message on VM for patient to give our office a call in regards to appointment scheduled for 05/07/18 at 8:55am.

## 2018-05-07 ENCOUNTER — Encounter: Payer: Self-pay | Admitting: Obstetrics & Gynecology

## 2018-05-11 ENCOUNTER — Telehealth: Payer: Self-pay | Admitting: Obstetrics & Gynecology

## 2018-05-11 NOTE — Telephone Encounter (Signed)
Pt missed 6/7 new gyn for tubo-ovarian abscess. Pt finished all meds. Pt states missed 6/7 appt due to graduation. Pt states still having some recurring pain. Pt given first available appt 06/01/18. Please advise pt. Verified home ph as correct.

## 2018-05-11 NOTE — Telephone Encounter (Signed)
Called patient and offered work in appt tomorrow 6/12 @ 935am. Patient verbalized understanding & states she will be here then. Patient had no questions.

## 2018-05-12 ENCOUNTER — Ambulatory Visit: Payer: Self-pay | Admitting: Obstetrics and Gynecology

## 2018-06-01 ENCOUNTER — Encounter: Payer: Self-pay | Admitting: Obstetrics & Gynecology

## 2019-11-28 ENCOUNTER — Ambulatory Visit: Payer: Self-pay | Attending: Internal Medicine

## 2020-05-03 IMAGING — CT CT ABD-PELV W/ CM
2 of 4 series · 15 of 46 positions shown, 17 images · IV contrast (APPLIED)
Comparison: None.

CLINICAL DATA: 26-year-old female with right flank pain. Diagnosed
with UTI and started antibiotic with no symptom relief.

EXAM:
CT ABDOMEN AND PELVIS WITH CONTRAST
TECHNIQUE: Multidetector CT imaging of the abdomen and pelvis was performed
using the standard protocol following bolus administration of
intravenous contrast.
CONTRAST:  100mL 95AXXD-5OO IOPAMIDOL (95AXXD-5OO) INJECTION 61%

[Series 3: axial st · axial · 0.64mm/px · z∈[-437,-32]mm · 12 of 97 slices shown, 14 images]
[im 8/97  soft-tissue]
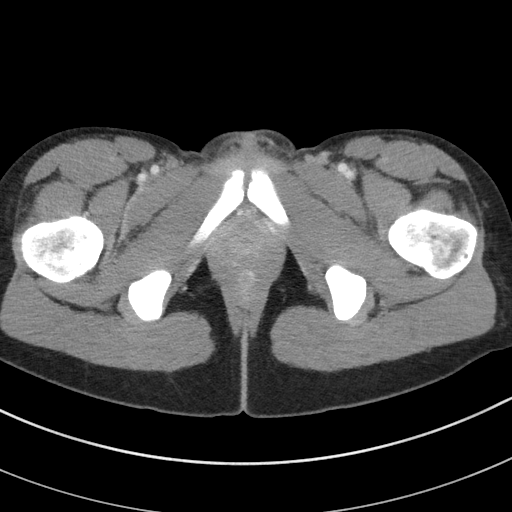
[im 8/97  bone]
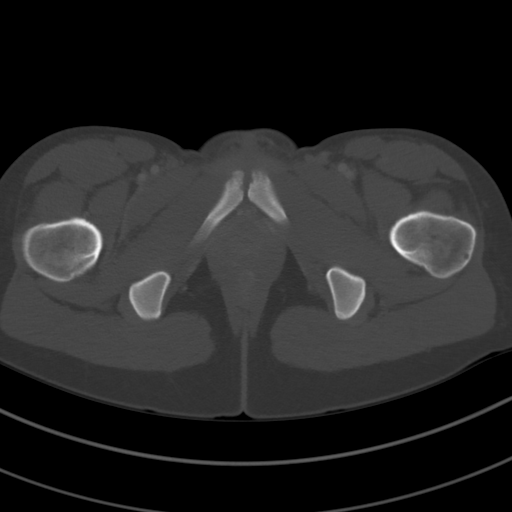
[im 15/97  soft-tissue]
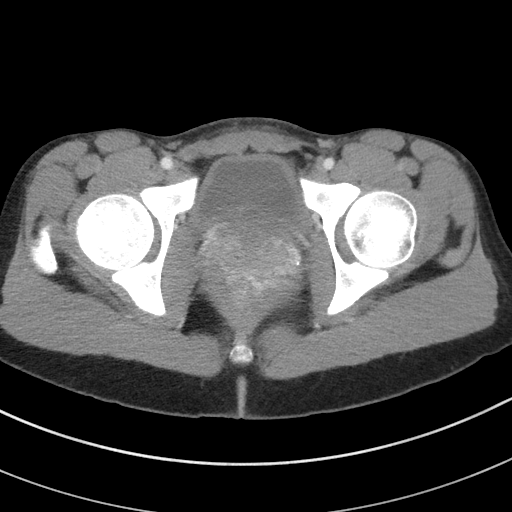
[im 23/97  soft-tissue]
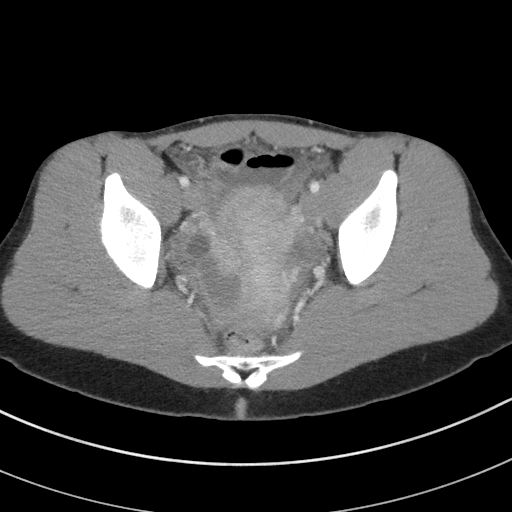
[im 30/97  soft-tissue]
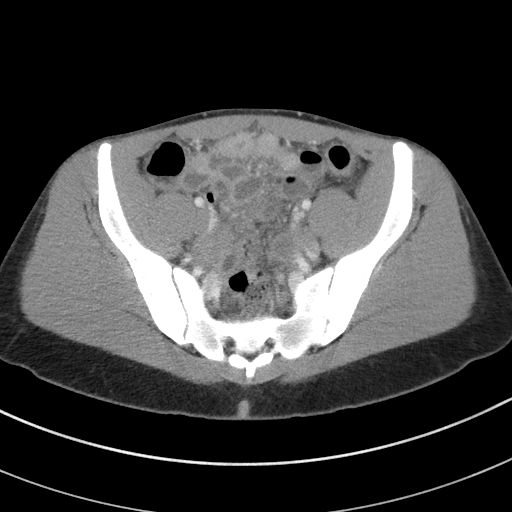
[im 37/97  soft-tissue]
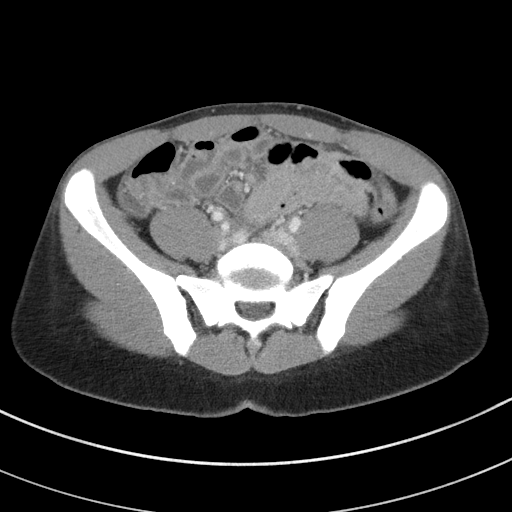
[im 45/97  soft-tissue]
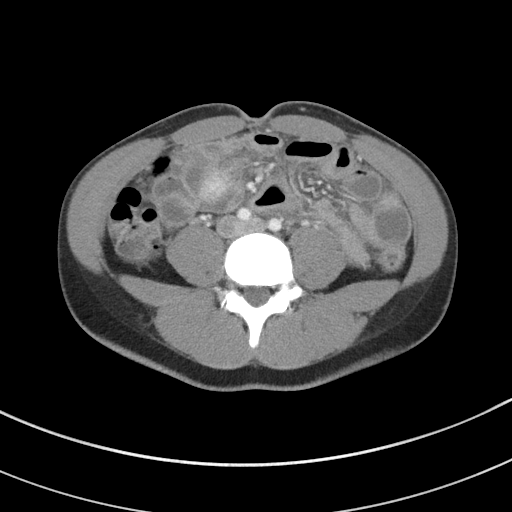
[im 52/97  soft-tissue]
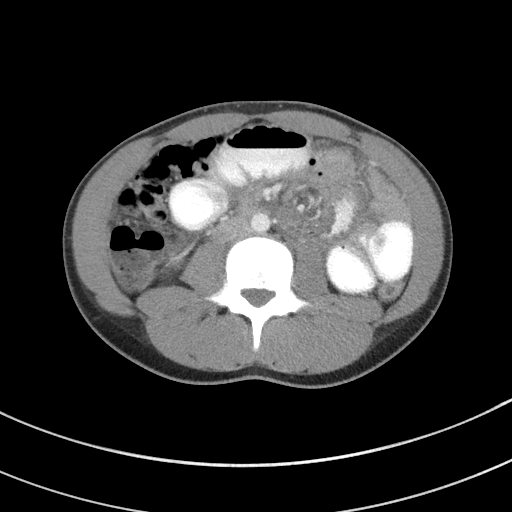
[im 60/97  soft-tissue]
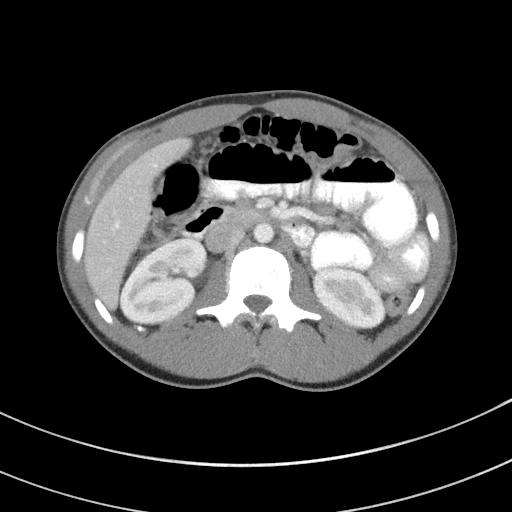
[im 67/97  soft-tissue]
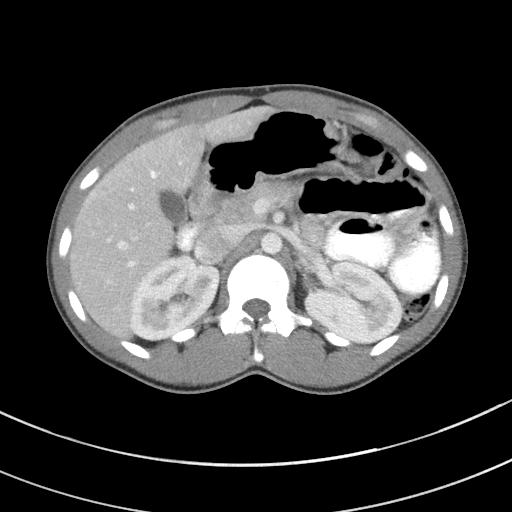
[im 67/97  bone]
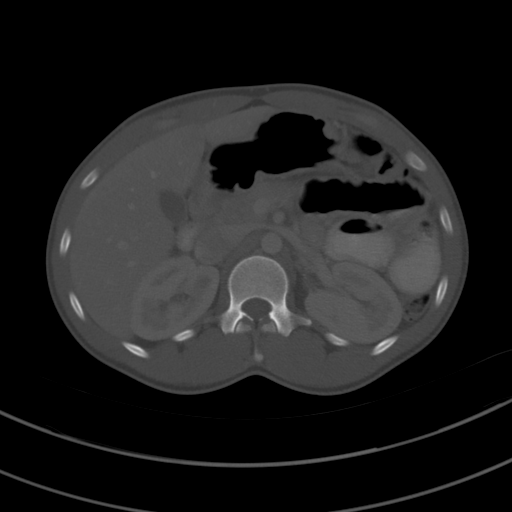
[im 74/97  soft-tissue]
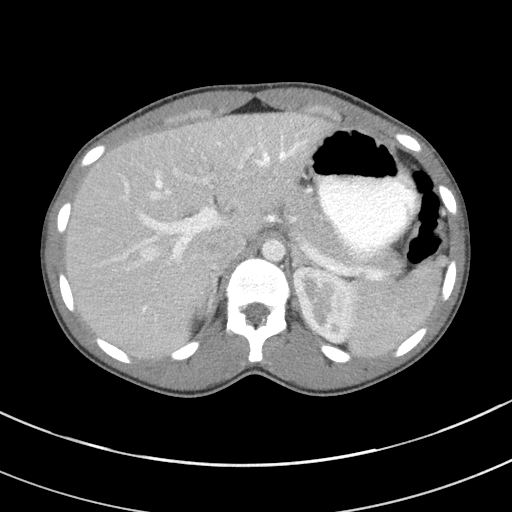
[im 82/97  soft-tissue]
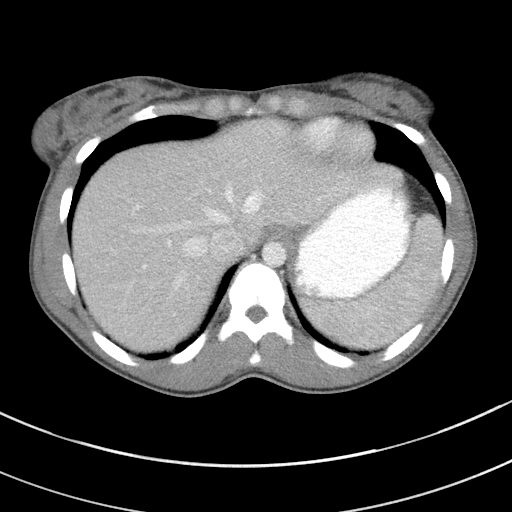
[im 89/97  soft-tissue]
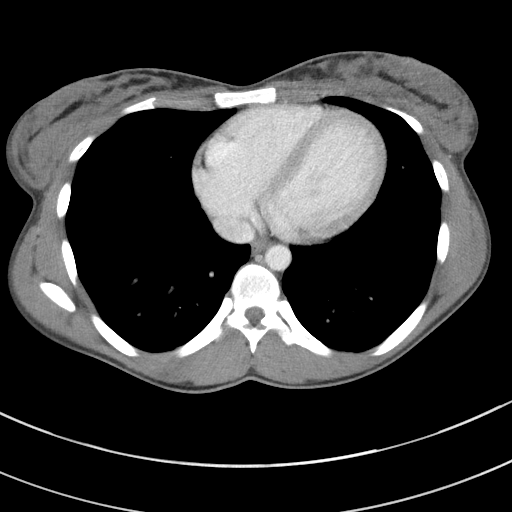

[Series 6: coronal st · coronal · 0.73mm/px · 3 of 64 slices shown]
[im 22/64  soft-tissue]
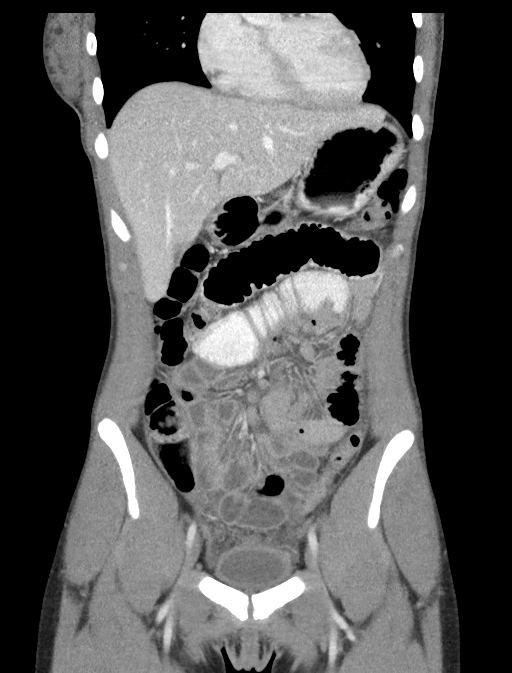
[im 29/64  soft-tissue]
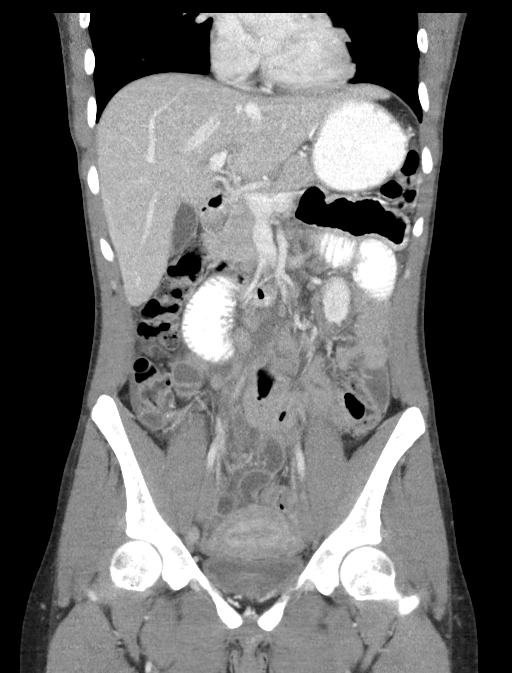
[im 36/64  soft-tissue]
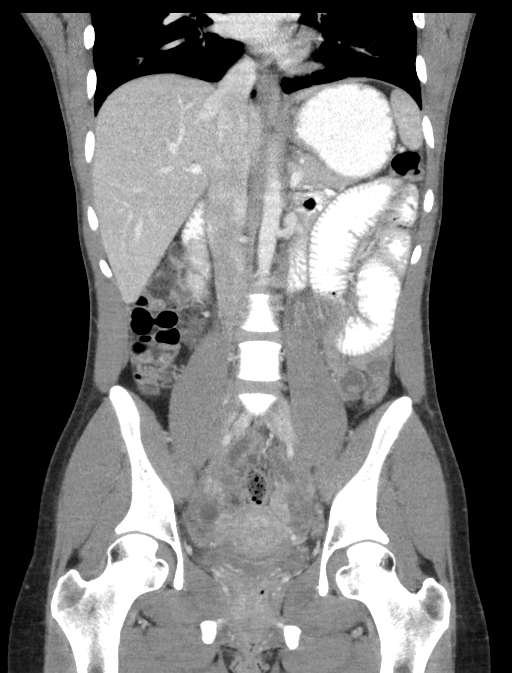

[15 of 46 positions shown; findings below may reference images not displayed]

FINDINGS: Lower chest: The visualized lung bases are clear.

No intra-abdominal free air. There is diffuse mesenteric edema and
small amount of free fluid within the pelvis.

Hepatobiliary: No focal liver abnormality is seen. Small
noncalcified stone or sludge may be present within the gallbladder.
No pericholecystic fluid. No dilatation of the biliary ducts.

Pancreas: Unremarkable. No pancreatic ductal dilatation or
surrounding inflammatory changes.

Spleen: Normal in size without focal abnormality.

Adrenals/Urinary Tract: Adrenal glands are unremarkable. Kidneys are
normal, without renal calculi, focal lesion, or hydronephrosis.
Bladder is unremarkable.

Stomach/Bowel: There is inflammatory changes and thickening of
multiple loops of small bowel in the mid to lower ab. There is
dilatation of the loop of bowel proximal to this segment which
measures up to 3.7 cm. Oral contrast is seen in the proximal small
bowel loops. The colon is unremarkable. The appendix is normal.

Vascular/Lymphatic: No significant vascular findings are present. No
enlarged abdominal or pelvic lymph nodes.

Reproductive: The uterus is anteverted and grossly unremarkable.
There are bilateral adnexal dilated tubal structures with complex
content measuring up to 6 x 3 cm on the right. There is diffuse
inflammatory changes and edema of the pelvic fat.

Other: None

Musculoskeletal: No acute or significant osseous findings.
IMPRESSION: 1. Inflammatory changes of the pelvis with complex dilated tubular
structures within the adnexa. Findings suspicious for pelvic
inflammatory disease and possible tubo-ovarian abscess. Correlation
with clinical exam and further evaluation with pelvic ultrasound
recommended.
2. Inflammatory changes of the mid small bowel loops with associated
ileus. This inflammatory changes may be secondary to inflammation of
the pelvis. Enteritis as the primary cause of the inflammatory
changes within the abdomen pelvis is favored less likely but not
entirely excluded clinical correlation is recommended. The appendix
is normal.

## 2020-05-21 ENCOUNTER — Encounter (HOSPITAL_BASED_OUTPATIENT_CLINIC_OR_DEPARTMENT_OTHER): Payer: Self-pay

## 2020-05-21 ENCOUNTER — Other Ambulatory Visit: Payer: Self-pay

## 2020-05-21 ENCOUNTER — Emergency Department (HOSPITAL_BASED_OUTPATIENT_CLINIC_OR_DEPARTMENT_OTHER)
Admission: EM | Admit: 2020-05-21 | Discharge: 2020-05-21 | Disposition: A | Discharge status: Home / Self Care | Payer: Self-pay | Attending: Emergency Medicine | Admitting: Emergency Medicine

## 2020-05-21 DIAGNOSIS — Y939 Activity, unspecified: Secondary | ICD-10-CM | POA: Insufficient documentation

## 2020-05-21 DIAGNOSIS — Y999 Unspecified external cause status: Secondary | ICD-10-CM | POA: Insufficient documentation

## 2020-05-21 DIAGNOSIS — S71152A Open bite, left thigh, initial encounter: Secondary | ICD-10-CM | POA: Insufficient documentation

## 2020-05-21 DIAGNOSIS — J45909 Unspecified asthma, uncomplicated: Secondary | ICD-10-CM | POA: Insufficient documentation

## 2020-05-21 DIAGNOSIS — W540XXA Bitten by dog, initial encounter: Secondary | ICD-10-CM

## 2020-05-21 DIAGNOSIS — Z23 Encounter for immunization: Secondary | ICD-10-CM | POA: Insufficient documentation

## 2020-05-21 DIAGNOSIS — Y929 Unspecified place or not applicable: Secondary | ICD-10-CM | POA: Insufficient documentation

## 2020-05-21 MED ORDER — AMOXICILLIN-POT CLAVULANATE 875-125 MG PO TABS: 1.0000 | ORAL_TABLET | Freq: Once | ORAL | Status: AC

## 2020-05-21 MED ORDER — TETANUS-DIPHTH-ACELL PERTUSSIS 5-2.5-18.5 LF-MCG/0.5 IM SUSP
INTRAMUSCULAR | Status: AC
  Filled 2020-05-21: qty 0.5

## 2020-05-21 MED ORDER — AMOXICILLIN-POT CLAVULANATE 875-125 MG PO TABS
ORAL_TABLET | ORAL | Status: AC
  Administered 2020-05-21: 1 via ORAL
  Filled 2020-05-21: qty 1

## 2020-05-21 MED ORDER — TETANUS-DIPHTH-ACELL PERTUSSIS 5-2.5-18.5 LF-MCG/0.5 IM SUSP
0.5000 mL | Freq: Once | INTRAMUSCULAR | Status: AC
  Administered 2020-05-21: 0.5 mL via INTRAMUSCULAR

## 2020-05-21 MED ORDER — AMOXICILLIN-POT CLAVULANATE 875-125 MG PO TABS: 1.0000 | ORAL_TABLET | Freq: Two times a day (BID) | ORAL | 0 refills | Status: DC

## 2020-05-21 NOTE — ED Notes (Signed)
Dr. Horton ED Provider at bedside. 

## 2020-05-21 NOTE — ED Provider Notes (Signed)
[facility] EMERGENCY DEPARTMENT Provider Note   CSN: [id number] Arrival date & time: 05/21/20  0005     History Chief Complaint  Patient presents with  . Animal Bite    Samantha Fowler is a 29 y.o. female.  HPI     This is a 29 year old female who presents with a dog bite.  Patient reports that her dog was playing with a neighbor's dog when she went down to pick up her dog, the neighbors dog bit her.  She believes the neighbor's dog's paw may have gotten stuck in her shirt which may prevent bite.  Reports that neighbor reported that he was vaccinated against rabies but she does not have the documentation.  She reports pain over the left thigh.  She reports 2 bites.  She reports burning pain.  Rates her pain at 5 out of 10.  No other injury.  Tetanus is not up-to-date.  Past Medical History:  Diagnosis Date  . Anemia   . Asthma   . Endometriosis     Patient Active Problem List   Diagnosis Date Noted  . PID (acute pelvic inflammatory disease) 04/10/2018  . Chlamydia 04/09/2018  . TOA (tubo-ovarian abscess) 04/08/2018    History reviewed. No pertinent surgical history.   OB History    Gravida  0   Para  0   Term  0   Preterm  0   AB  0   Living  0     SAB  0   TAB  0   Ectopic  0   Multiple  0   Live Births  0           No family history on file.  Social History   Tobacco Use  . Smoking status: Never Smoker  . Smokeless tobacco: Never Used  Vaping Use  . Vaping Use: Never used  Substance Use Topics  . Alcohol use: Yes  . Drug use: Never    Home Medications Prior to Admission medications   Medication Sig Start Date End Date Taking? Authorizing Provider  cholecalciferol (VITAMIN D3) 25 MCG (1000 UNIT) tablet Take 1,000 Units by mouth daily.   Yes [provider]  escitalopram (LEXAPRO) 10 MG tablet Take 10 mg by mouth daily.   Yes [provider]  amoxicillin-clavulanate (AUGMENTIN) 875-125 MG tablet Take 1  tablet by mouth every 12 (twelve) hours. 05/21/20   Zeb Rawl, Barbette Hair, MD  doxycycline (VIBRAMYCIN) 100 MG capsule Take 1 capsule (100 mg total) by mouth 2 (two) times daily. 04/10/18   Sloan Leiter, MD  ibuprofen (ADVIL,MOTRIN) 600 MG tablet Take 1 tablet (600 mg total) by mouth every 6 (six) hours as needed (mild pain). 04/10/18   Sloan Leiter, MD  metroNIDAZOLE (FLAGYL) 500 MG tablet Take 1 tablet (500 mg total) by mouth 2 (two) times daily. 04/10/18   Sloan Leiter, MD  ondansetron (ZOFRAN ODT) 4 MG disintegrating tablet Take 1 tablet (4 mg total) by mouth every 8 (eight) hours as needed for nausea or vomiting. 04/10/18   Sloan Leiter, MD    Allergies    Patient has no known allergies.  Review of Systems   Review of Systems  Constitutional: Negative for fever.  Skin: Positive for wound.  All other systems reviewed and are negative.   Physical Exam Updated Vital Signs BP 114/82 (BP Location: Left Arm)   Pulse 80   Temp 98 F (36.7 C) (Oral)   Resp 20  Ht 1.651 m (5\' 5" )   Wt 66.7 kg   LMP 05/16/2020   SpO2 99%   BMI 24.46 kg/m   Physical Exam Vitals and nursing note reviewed.  Constitutional:      Appearance: She is well-developed. She is not ill-appearing.  HENT:     Head: Normocephalic and atraumatic.     Nose: Nose normal.     Mouth/Throat:     Mouth: Mucous membranes are moist.  Eyes:     Pupils: Pupils are equal, round, and reactive to light.  Cardiovascular:     Rate and Rhythm: Normal rate and regular rhythm.  Pulmonary:     Effort: Pulmonary effort is normal. No respiratory distress.  Abdominal:     General: Abdomen is flat.  Musculoskeletal:        General: Tenderness present. No deformity.  Skin:    General: Skin is warm and dry.     Comments: 2 discrete lesions on the left anterior thigh consistent with bite, slight bruising noted over the inferior lesion, no gaping lacerations, bleeding controlled  Neurological:     Mental Status: She is alert  and oriented to person, place, and time.  Psychiatric:        Mood and Affect: Mood normal.     ED Results / Procedures / Treatments   Labs (all labs ordered are listed, but only abnormal results are displayed) Labs Reviewed - No data to display  EKG None  Radiology No results found.  Procedures Procedures (including critical care time)  Medications Ordered in ED Medications  Tdap (BOOSTRIX) injection 0.5 mL (has no administration in time range)  amoxicillin-clavulanate (AUGMENTIN) 875-125 MG per tablet 1 tablet (has no administration in time range)    ED Course  I have reviewed the triage vital signs and the nursing notes.  Pertinent labs & imaging results that were available during my care of the patient were reviewed by me and considered in my medical decision making (see chart for details).    MDM Rules/Calculators/A&P                           Patient presents with a dog bite to the left thigh.  Overall nontoxic and vital signs are reassuring.  She has 2 discrete bite marks over the left thigh.  No gaping lacerations or significant bleeding.  Discussed that these would likely heal and do not require any laceration care.  Discussed supportive care to the wounds.  Will start on Augmentin.  We had an extensive discussion regarding rabies prophylaxis.  Will attempt to get documentation of rabies vaccination from her neighbor.  She would like to defer prophylaxis at this time.  If she cannot confirm vaccine status, she will return for rabies prophylaxis.  Tetanus was updated.  No indication for imaging  After history, exam, and medical workup I feel the patient has been appropriately medically screened and is safe for discharge home. Pertinent diagnoses were discussed with the patient. Patient was given return precautions.   Final Clinical Impression(s) / ED Diagnoses Final diagnoses:  Dog bite, initial encounter    Rx / DC Orders ED Discharge Orders         Ordered     amoxicillin-clavulanate (AUGMENTIN) 875-125 MG tablet  Every 12 hours     Discontinue  Reprint     05/21/20 0105           Rolf Fells, 05/23/20, MD 05/21/20 0120

## 2020-05-21 NOTE — Discharge Instructions (Addendum)
You were seen today after dog bite.  Make sure to confirm rabies vaccination status of dog.  If unable to confirm, you will need to return for rabies vaccine series.  Keep bites covered with antibiotic ointment and take antibiotics as prescribed.  Monitor for signs and symptoms of infection.

## 2020-05-21 NOTE — ED Triage Notes (Signed)
Attempted to call pt from lobby and no patient found. Pt came running back in and asking if we called her.   Pt reports that she was bite twice on the L thigh twice by a medium size dog. Dog is not known, but per pt the dogs owner told her the dog was vaccinated.

## 2020-05-24 ENCOUNTER — Encounter (HOSPITAL_BASED_OUTPATIENT_CLINIC_OR_DEPARTMENT_OTHER): Payer: Self-pay | Admitting: Emergency Medicine

## 2020-05-24 ENCOUNTER — Emergency Department (HOSPITAL_BASED_OUTPATIENT_CLINIC_OR_DEPARTMENT_OTHER)
Admission: EM | Admit: 2020-05-24 | Discharge: 2020-05-24 | Discharge status: Against Medical Advice | Payer: Self-pay | Attending: Emergency Medicine | Admitting: Emergency Medicine

## 2020-05-24 ENCOUNTER — Other Ambulatory Visit: Payer: Self-pay

## 2020-05-24 DIAGNOSIS — Y999 Unspecified external cause status: Secondary | ICD-10-CM | POA: Insufficient documentation

## 2020-05-24 DIAGNOSIS — S81852A Open bite, left lower leg, initial encounter: Secondary | ICD-10-CM | POA: Insufficient documentation

## 2020-05-24 DIAGNOSIS — Y939 Activity, unspecified: Secondary | ICD-10-CM | POA: Insufficient documentation

## 2020-05-24 DIAGNOSIS — W540XXA Bitten by dog, initial encounter: Secondary | ICD-10-CM | POA: Insufficient documentation

## 2020-05-24 DIAGNOSIS — Z5321 Procedure and treatment not carried out due to patient leaving prior to being seen by health care provider: Secondary | ICD-10-CM | POA: Insufficient documentation

## 2020-05-24 DIAGNOSIS — Y929 Unspecified place or not applicable: Secondary | ICD-10-CM | POA: Insufficient documentation

## 2020-05-24 NOTE — ED Triage Notes (Signed)
Pt here for recheck of dog bite to L thigh. States she feels like it is getting worse.

## 2021-10-03 ENCOUNTER — Encounter (HOSPITAL_COMMUNITY): Payer: Self-pay | Admitting: Emergency Medicine

## 2021-10-03 ENCOUNTER — Emergency Department (HOSPITAL_COMMUNITY)
Admission: EM | Admit: 2021-10-03 | Discharge: 2021-10-03 | Disposition: A | Discharge status: Home / Self Care | Payer: Self-pay | Attending: Emergency Medicine | Admitting: Emergency Medicine

## 2021-10-03 DIAGNOSIS — M79631 Pain in right forearm: Secondary | ICD-10-CM

## 2021-10-03 DIAGNOSIS — J45909 Unspecified asthma, uncomplicated: Secondary | ICD-10-CM | POA: Insufficient documentation

## 2021-10-03 DIAGNOSIS — X503XXA Overexertion from repetitive movements, initial encounter: Secondary | ICD-10-CM | POA: Insufficient documentation

## 2021-10-03 MED ORDER — PREDNISONE 10 MG PO TABS: 40.0000 mg | ORAL_TABLET | Freq: Every day | ORAL | 0 refills | Status: AC

## 2021-10-03 NOTE — ED Provider Notes (Signed)
[facility]-EMERGENCY DEPT Provider Note   CSN: [id number] Arrival date & time: 10/03/21  1804     History Chief Complaint  Patient presents with   Arm Pain    Samantha Fowler is a 30 y.o. female presenting for evaluation of arm pain.  Patient states that the past week she has had pain from her elbow to her wrist of her right arm.  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  This makes her symptoms worse.  She has not taken anything for her pain.  No pain extending into the hand.  No numbness.  No fall, trauma, or injury.  She reports no medical problems, takes no medications daily  HPI     Past Medical History:  Diagnosis Date   Anemia    Asthma    Endometriosis     Patient Active Problem List   Diagnosis Date Noted   PID (acute pelvic inflammatory disease) 04/10/2018   Chlamydia 04/09/2018   TOA (tubo-ovarian abscess) 04/08/2018    History reviewed. No pertinent surgical history.   OB History     Gravida  0   Para  0   Term  0   Preterm  0   AB  0   Living  0      SAB  0   IAB  0   Ectopic  0   Multiple  0   Live Births  0           History reviewed. No pertinent family history.  Social History   Tobacco Use   Smoking status: Never   Smokeless tobacco: Never  Vaping Use   Vaping Use: Never used  Substance Use Topics   Alcohol use: Yes   Drug use: Never    Home Medications Prior to Admission medications   Medication Sig Start Date End Date Taking? Authorizing Provider  predniSONE (DELTASONE) 10 MG tablet Take 4 tablets (40 mg total) by mouth daily for 5 days. 10/03/21 10/08/21 Yes Daquarius Dubeau, PA-C  amoxicillin-clavulanate (AUGMENTIN) 875-125 MG tablet Take 1 tablet by mouth every 12 (twelve) hours. 05/21/20   Horton, Mayer Masker, MD  cholecalciferol (VITAMIN D3) 25 MCG (1000 UNIT) tablet Take 1,000 Units by mouth daily.    [provider]  doxycycline (VIBRAMYCIN)  100 MG capsule Take 1 capsule (100 mg total) by mouth 2 (two) times daily. 04/10/18   Conan Bowens, MD  escitalopram (LEXAPRO) 10 MG tablet Take 10 mg by mouth daily.    [provider]  ibuprofen (ADVIL,MOTRIN) 600 MG tablet Take 1 tablet (600 mg total) by mouth every 6 (six) hours as needed (mild pain). 04/10/18   Conan Bowens, MD  metroNIDAZOLE (FLAGYL) 500 MG tablet Take 1 tablet (500 mg total) by mouth 2 (two) times daily. 04/10/18   Conan Bowens, MD  ondansetron (ZOFRAN ODT) 4 MG disintegrating tablet Take 1 tablet (4 mg total) by mouth every 8 (eight) hours as needed for nausea or vomiting. 04/10/18   Conan Bowens, MD    Allergies    Patient has no known allergies.  Review of Systems   Review of Systems  Musculoskeletal:  Positive for myalgias.  Neurological:  Negative for numbness.   Physical Exam Updated Vital Signs BP 140/86 (BP Location: Left Arm)   Pulse 76   Temp 98.5 F (36.9 C) (Oral)   Resp 16   Ht 5\' 5"  (1.651 m)   Wt 62.1  kg   LMP 10/02/2021 (Exact Date)   SpO2 99%   BMI 22.80 kg/m   Physical Exam Vitals and nursing note reviewed.  Constitutional:      General: She is not in acute distress.    Appearance: She is well-developed.  HENT:     Head: Normocephalic and atraumatic.  Eyes:     Extraocular Movements: Extraocular movements intact.  Cardiovascular:     Rate and Rhythm: Normal rate.  Pulmonary:     Effort: Pulmonary effort is normal.  Abdominal:     General: There is no distension.  Musculoskeletal:        General: Tenderness present. Normal range of motion.     Cervical back: Normal range of motion.     Comments: Tenderness palpation of medial and lateral epicondyle.  No erythema or warmth.  No pain over the olecranon.  No pain of the forearm.  Tenderness palpation of radial and ulnar wrist.  Negative Tinel's.  Grip strength equal bilaterally.  Radial pulses 2+ bilaterally.  Good distal sensation and cap refill in the forearm.  Full  active range of motion of the wrist, however pain with flexion extension.  No pain with abduction and abduction.  Full active range of motion of the elbow, but increased pain with flexion and extension  Skin:    General: Skin is warm.     Findings: No rash.  Neurological:     Mental Status: She is alert and oriented to person, place, and time.    ED Results / Procedures / Treatments   Labs (all labs ordered are listed, but only abnormal results are displayed) Labs Reviewed - No data to display  EKG None  Radiology No results found.  Procedures Procedures   Medications Ordered in ED Medications - No data to display  ED Course  I have reviewed the triage vital signs and the nursing notes.  Pertinent labs & imaging results that were available during my care of the patient were reviewed by me and considered in my medical decision making (see chart for details).    MDM Rules/Calculators/A&P                           Patient presenting for evaluation of right arm pain.  On exam, patient peers nontoxic.  Pain is reproducible with palpation over the insertion and origin of the muscles of the forearm.  Likely overuse injury/tendinitis.  Discussed with patient.  Discussed symptomatic management.  Discussed treating with anti-inflammatory such as prednisone, and follow-up with orthopedics.  Sling given for comfort.  At this time, patient appears safe for discharge.  Return precautions given.  Patient states he understands and agrees to plan.   Final Clinical Impression(s) / ED Diagnoses Final diagnoses:  Right forearm pain    Rx / DC Orders ED Discharge Orders          Ordered    predniSONE (DELTASONE) 10 MG tablet  Daily        10/03/21 1933             Alveria Apley, PA-C 10/03/21 2001    Pollyann Savoy, MD 10/03/21 2308

## 2021-10-03 NOTE — ED Notes (Signed)
An After Visit Summary was printed and given to the patient. Discharge instructions given and no further questions at this time.  

## 2021-10-03 NOTE — ED Triage Notes (Signed)
Pt states that she has had R arm pain with motion x 1 week. Lifts boxes for a living. Alert and oriented.

## 2021-10-03 NOTE — ED Notes (Signed)
Ortho tech bedside 

## 2021-10-03 NOTE — Progress Notes (Signed)
Orthopedic Tech Progress Note Patient Details:  Samantha Fowler [birth year] [id number]  Ortho Devices Type of Ortho Device: Sling immobilizer Ortho Device/Splint Location: RUE Ortho Device/Splint Interventions: Ordered, Application, Adjustment   Post Interventions Patient Tolerated: Well Instructions Provided: Adjustment of device, Care of device  [place] A Bethene Hankinson 10/03/2021, 8:10 PM

## 2021-10-03 NOTE — Discharge Instructions (Signed)
Take prednisone as prescribed to help with pain and inflammation. Do not take other and anti-inflammatory such as ibuprofen or Aleve while taking this medicine. You may supplement with Tylenol as needed for further pain control. Use the sling to help with pain control. Use ice at the elbow, 3 times a day for 20 minutes, to help decrease inflammation. Follow-up with orthopedic doctor listed below.  Call to set up an appointment. Return to the emergency room develop severe worsening pain, color change in your hand, persistent numbness in your hand, any new, worsening, or concerning symptoms

## 2021-10-10 ENCOUNTER — Emergency Department (HOSPITAL_COMMUNITY)
Admission: EM | Admit: 2021-10-10 | Discharge: 2021-10-10 | Disposition: A | Discharge status: Home / Self Care | Payer: Medicaid Other | Attending: Emergency Medicine | Admitting: Emergency Medicine

## 2021-10-10 ENCOUNTER — Encounter (HOSPITAL_COMMUNITY): Payer: Self-pay | Admitting: Oncology

## 2021-10-10 ENCOUNTER — Other Ambulatory Visit: Payer: Self-pay

## 2021-10-10 DIAGNOSIS — M79631 Pain in right forearm: Secondary | ICD-10-CM | POA: Insufficient documentation

## 2021-10-10 DIAGNOSIS — J45909 Unspecified asthma, uncomplicated: Secondary | ICD-10-CM | POA: Insufficient documentation

## 2021-10-10 DIAGNOSIS — M25521 Pain in right elbow: Secondary | ICD-10-CM | POA: Insufficient documentation

## 2021-10-10 DIAGNOSIS — M25531 Pain in right wrist: Secondary | ICD-10-CM | POA: Insufficient documentation

## 2021-10-10 LAB — COMPREHENSIVE METABOLIC PANEL
ALT: 15 U/L (ref 0–44)
AST: 33 U/L (ref 15–41)
Albumin: 3.6 g/dL (ref 3.5–5.0)
Alkaline Phosphatase: 60 U/L (ref 38–126)
Anion gap: 7 (ref 5–15)
BUN: 12 mg/dL (ref 6–20)
CO2: 24 mmol/L (ref 22–32)
Calcium: 8.6 mg/dL — ABNORMAL LOW (ref 8.9–10.3)
Chloride: 106 mmol/L (ref 98–111)
Creatinine, Ser: 0.67 mg/dL (ref 0.44–1.00)
GFR, Estimated: >60 mL/min (ref >60)
Glucose, Bld: 93 mg/dL (ref 70–99)
Potassium: 3.5 mmol/L (ref 3.5–5.1)
Sodium: 137 mmol/L (ref 135–145)
Total Bilirubin: 0.4 mg/dL (ref 0.3–1.2)
Total Protein: 7.6 g/dL (ref 6.5–8.1)

## 2021-10-10 LAB — I-STAT BETA HCG BLOOD, ED (MC, WL, AP ONLY): I-stat hCG, quantitative: <5 m[IU]/mL (ref <5)

## 2021-10-10 LAB — PROTIME-INR
INR: 1.1 (ref 0.8–1.2)
Prothrombin Time: 13.7 seconds (ref 11.4–15.2)

## 2021-10-10 LAB — CBC
HCT: 27.1 % — ABNORMAL LOW (ref 36.0–46.0)
Hemoglobin: 7.7 g/dL — ABNORMAL LOW (ref 12.0–15.0)
MCH: 22.5 pg — ABNORMAL LOW (ref 26.0–34.0)
MCHC: 28.4 g/dL — ABNORMAL LOW (ref 30.0–36.0)
MCV: 79.2 fL — ABNORMAL LOW (ref 80.0–100.0)
Platelets: 347 10*3/uL (ref 150–400)
RBC: 3.42 MIL/uL — ABNORMAL LOW (ref 3.87–5.11)
RDW: 22.1 % — ABNORMAL HIGH (ref 11.5–15.5)
WBC: 5.1 10*3/uL (ref 4.0–10.5)
nRBC: 0 % (ref 0.0–0.2)

## 2021-10-10 LAB — CK: Total CK: 122 U/L (ref 38–234)

## 2021-10-10 LAB — ACETAMINOPHEN LEVEL: Acetaminophen (Tylenol), Serum: <10 ug/mL — ABNORMAL LOW (ref 10–30)

## 2021-10-10 MED ORDER — HYDROCODONE-ACETAMINOPHEN 5-325 MG PO TABS: 1.0000 | ORAL_TABLET | Freq: Four times a day (QID) | ORAL | 0 refills | Status: DC | PRN

## 2021-10-10 NOTE — ED Provider Notes (Signed)
[facility]-EMERGENCY DEPT Provider Note   CSN: [id number] Arrival date & time: 10/10/21  0901     History Chief Complaint  Patient presents with   Arm Pain    Samantha Fowler is a 30 y.o. female.   Arm Pain Pertinent negatives include no chest pain, no abdominal pain and no shortness of breath. Patient presents with right wrist and elbow pain.  Forearm pain.  Had for around 3 weeks now.  Seen in the ER and given steroids.  Also given braces.  States there was some fluid on the elbow.  States that he took the steroids without much relief.  States pain is gotten worse.  States that she takes it easy and use the brace when she is not working but is still been working at [employer].  States there is pain that goes into the hand.  No fevers.  States has been taking Tylenol.  States she has been taking about 20 Tylenol pills a day.     Past Medical History:  Diagnosis Date   Anemia    Asthma    Endometriosis     Patient Active Problem List   Diagnosis Date Noted   PID (acute pelvic inflammatory disease) 04/10/2018   Chlamydia 04/09/2018   TOA (tubo-ovarian abscess) 04/08/2018    History reviewed. No pertinent surgical history.   OB History     Gravida  0   Para  0   Term  0   Preterm  0   AB  0   Living  0      SAB  0   IAB  0   Ectopic  0   Multiple  0   Live Births  0           No family history on file.  Social History   Tobacco Use   Smoking status: Never   Smokeless tobacco: Never  Vaping Use   Vaping Use: Never used  Substance Use Topics   Alcohol use: Yes   Drug use: Never    Home Medications Prior to Admission medications   Medication Sig Start Date End Date Taking? Authorizing Provider  HYDROcodone-acetaminophen (NORCO/VICODIN) 5-325 MG tablet Take 1-2 tablets by mouth every 6 (six) hours as needed. 10/10/21  Yes Benjiman Core, MD  amoxicillin-clavulanate (AUGMENTIN) 875-125 MG tablet Take 1 tablet by mouth  every 12 (twelve) hours. 05/21/20   Horton, Mayer Masker, MD  cholecalciferol (VITAMIN D3) 25 MCG (1000 UNIT) tablet Take 1,000 Units by mouth daily.    [provider]  doxycycline (VIBRAMYCIN) 100 MG capsule Take 1 capsule (100 mg total) by mouth 2 (two) times daily. 04/10/18   Conan Bowens, MD  escitalopram (LEXAPRO) 10 MG tablet Take 10 mg by mouth daily.    [provider]  ibuprofen (ADVIL,MOTRIN) 600 MG tablet Take 1 tablet (600 mg total) by mouth every 6 (six) hours as needed (mild pain). 04/10/18   Conan Bowens, MD  metroNIDAZOLE (FLAGYL) 500 MG tablet Take 1 tablet (500 mg total) by mouth 2 (two) times daily. 04/10/18   Conan Bowens, MD  ondansetron (ZOFRAN ODT) 4 MG disintegrating tablet Take 1 tablet (4 mg total) by mouth every 8 (eight) hours as needed for nausea or vomiting. 04/10/18   Conan Bowens, MD    Allergies    Patient has no known allergies.  Review of Systems   Review of Systems  Constitutional:  Negative for appetite change.  HENT:  Negative  for congestion.   Respiratory:  Negative for shortness of breath.   Cardiovascular:  Negative for chest pain.  Gastrointestinal:  Negative for abdominal pain.  Musculoskeletal:        Right hand and forearm pain.  Skin:  Negative for rash.  Neurological:  Negative for weakness and numbness.  Psychiatric/Behavioral:  Negative for confusion.    Physical Exam Updated Vital Signs BP (!) 135/97   Pulse (!) 50   Temp 98.2 F (36.8 C) (Oral)   Resp 18   Ht 5\' 5"  (1.651 m)   Wt 62.1 kg   LMP 10/02/2021 (Exact Date)   SpO2 100%   BMI 22.80 kg/m   Physical Exam Vitals and nursing note reviewed.  HENT:     Head: Atraumatic.  Eyes:     General: No scleral icterus. Cardiovascular:     Rate and Rhythm: Regular rhythm.  Abdominal:     Tenderness: There is no abdominal tenderness.  Musculoskeletal:        General: Tenderness present.     Comments: Pain was active and flexion movement at the wrist.   Pain with palpation of the forearm.  Pulse intact.  Skin:    General: Skin is warm.     Capillary Refill: Capillary refill takes less than 2 seconds.  Neurological:     Mental Status: She is alert and oriented to person, place, and time.    ED Results / Procedures / Treatments   Labs (all labs ordered are listed, but only abnormal results are displayed) Labs Reviewed  COMPREHENSIVE METABOLIC PANEL - Abnormal; Notable for the following components:      Result Value   Calcium 8.6 (*)    All other components within normal limits  ACETAMINOPHEN LEVEL - Abnormal; Notable for the following components:   Acetaminophen (Tylenol), Serum <10 (*)    All other components within normal limits  CBC - Abnormal; Notable for the following components:   RBC 3.42 (*)    Hemoglobin 7.7 (*)    HCT 27.1 (*)    MCV 79.2 (*)    MCH 22.5 (*)    MCHC 28.4 (*)    RDW 22.1 (*)    All other components within normal limits  PROTIME-INR  CK  I-STAT BETA HCG BLOOD, ED ([facility], [facility], [facility] ONLY)    EKG None  Radiology No results found.  Procedures Procedures   Medications Ordered in ED Medications - No data to display  ED Course  I have reviewed the triage vital signs and the nursing notes.  Pertinent labs & imaging results that were available during my care of the patient were reviewed by me and considered in my medical decision making (see chart for details).    MDM Rules/Calculators/A&P                           Patient with right forearm pain.  Seen by ER previously for same.  Elbow and wrist pain.  I think most likely overuse injury.  Works for 13/01/2021 and has been using it extensively.  Pain worse with movements.  Some tenderness over wrist and elbow.  Has still been working 5 days a week but states she is resting at the days she is not working.  Has orthopedic surgery follow-up on Tuesday.  However stated she had she had been taking 20 Tylenol tablets a day.  Has had steroids without much relief.   Blood work done to evaluate  for potential Tylenol toxicity and reassuring.  Discussed with patient that this is too much medication to take.  Doubt compartment syndrome.  I do not feel imaging would really help much at this time there is no acute injury.  We will have patient follow-up as an outpatient as planned.  Discharge home Final Clinical Impression(s) / ED Diagnoses Final diagnoses:  Pain of right forearm    Rx / DC Orders ED Discharge Orders          Ordered    HYDROcodone-acetaminophen (NORCO/VICODIN) 5-325 MG tablet  Every 6 hours PRN        10/10/21 1340             Benjiman Core, MD 10/11/21 0715

## 2021-10-10 NOTE — Discharge Instructions (Addendum)
Follow-up with orthopedic surgery on Tuesday as planned.  I would take off work until then.  The pain medicines may also help.  However do not take extra Tylenol.

## 2021-10-10 NOTE — ED Triage Notes (Signed)
Pt reports right arm pain x 3 weeks.  Seen here for the same w/ recommended follow up w/ orthopedic.  Pt does have appointment however cannot tolerate the pain while waiting on the appointment. Pt reports taking 25 750 mg tylenol tablets daily.  Pt finished course of prednisone.

## 2021-11-10 ENCOUNTER — Encounter (HOSPITAL_COMMUNITY): Payer: Self-pay

## 2021-11-10 ENCOUNTER — Emergency Department (HOSPITAL_COMMUNITY)
Admission: EM | Admit: 2021-11-10 | Discharge: 2021-11-10 | Disposition: A | Discharge status: Home / Self Care | Payer: Medicaid Other | Attending: Emergency Medicine | Admitting: Emergency Medicine

## 2021-11-10 ENCOUNTER — Other Ambulatory Visit: Payer: Self-pay

## 2021-11-10 DIAGNOSIS — J45909 Unspecified asthma, uncomplicated: Secondary | ICD-10-CM | POA: Insufficient documentation

## 2021-11-10 DIAGNOSIS — X503XXA Overexertion from repetitive movements, initial encounter: Secondary | ICD-10-CM | POA: Insufficient documentation

## 2021-11-10 DIAGNOSIS — S29011A Strain of muscle and tendon of front wall of thorax, initial encounter: Secondary | ICD-10-CM | POA: Insufficient documentation

## 2021-11-10 MED ORDER — IBUPROFEN 800 MG PO TABS: 800.0000 mg | ORAL_TABLET | Freq: Three times a day (TID) | ORAL | 0 refills | Status: DC

## 2021-11-10 MED ORDER — LIDOCAINE 5 % EX PTCH: 1.0000 | MEDICATED_PATCH | CUTANEOUS | 0 refills | Status: DC

## 2021-11-10 MED ORDER — METHOCARBAMOL 500 MG PO TABS: 500.0000 mg | ORAL_TABLET | Freq: Two times a day (BID) | ORAL | 0 refills | Status: DC

## 2021-11-10 NOTE — Discharge Instructions (Addendum)
Please use Tylenol or ibuprofen for pain.  You may use 600-800 mg ibuprofen every 6 hours or 1000 mg of Tylenol every 6 hours.  You may choose to alternate between the 2.  This would be most effective.  Not to exceed 4 g of Tylenol within 24 hours.  Not to exceed 3200 mg ibuprofen 24 hours.  You can use the robaxin up to twice daily in addition to the above. You can apply the lidocaine patches directly to where it hurts.  If your symptoms persist despite the treatment as discussed above including rest, ice, pain control I recommend follow-up with orthopedics for further evaluation.

## 2021-11-10 NOTE — ED Provider Notes (Signed)
[facility]-EMERGENCY DEPT Provider Note   CSN: [id number] Arrival date & time: 11/10/21  1513     History Chief Complaint  Patient presents with   Rib Injury    Samantha Fowler is a 30 y.o. female with no sx PMH who presents with right sided sharp rib pain since Thursday she rates 10/10. Patient reports minimal relief with heating pad, tylenol, alleve. Patient reports movement makes it worse. Patient reports she works for [employer], and the injury began after lifting and twisting repetitively back and forth over the last several shifts. Patient reports some pain with deep breathing. Patient denies fall.   HPI     Past Medical History:  Diagnosis Date   Anemia    Asthma    Endometriosis     Patient Active Problem List   Diagnosis Date Noted   PID (acute pelvic inflammatory disease) 04/10/2018   Chlamydia 04/09/2018   TOA (tubo-ovarian abscess) 04/08/2018    History reviewed. No pertinent surgical history.   OB History     Gravida  0   Para  0   Term  0   Preterm  0   AB  0   Living  0      SAB  0   IAB  0   Ectopic  0   Multiple  0   Live Births  0           No family history on file.  Social History   Tobacco Use   Smoking status: Never   Smokeless tobacco: Never  Vaping Use   Vaping Use: Never used  Substance Use Topics   Alcohol use: Yes   Drug use: Never    Home Medications Prior to Admission medications   Medication Sig Start Date End Date Taking? Authorizing Provider  ibuprofen (ADVIL) 800 MG tablet Take 1 tablet (800 mg total) by mouth 3 (three) times daily. 11/10/21  Yes Huntley Demedeiros H, PA-C  lidocaine (LIDODERM) 5 % Place 1 patch onto the skin daily. Remove & Discard patch within 12 hours or as directed by MD 11/10/21  Yes Grisela Mesch H, PA-C  methocarbamol (ROBAXIN) 500 MG tablet Take 1 tablet (500 mg total) by mouth 2 (two) times daily. 11/10/21  Yes Hallel Denherder H, PA-C   amoxicillin-clavulanate (AUGMENTIN) 875-125 MG tablet Take 1 tablet by mouth every 12 (twelve) hours. 05/21/20   Horton, Mayer Masker, MD  cholecalciferol (VITAMIN D3) 25 MCG (1000 UNIT) tablet Take 1,000 Units by mouth daily.    [provider]  doxycycline (VIBRAMYCIN) 100 MG capsule Take 1 capsule (100 mg total) by mouth 2 (two) times daily. 04/10/18   Conan Bowens, MD  escitalopram (LEXAPRO) 10 MG tablet Take 10 mg by mouth daily.    [provider]  HYDROcodone-acetaminophen (NORCO/VICODIN) 5-325 MG tablet Take 1-2 tablets by mouth every 6 (six) hours as needed. 10/10/21   Benjiman Core, MD  metroNIDAZOLE (FLAGYL) 500 MG tablet Take 1 tablet (500 mg total) by mouth 2 (two) times daily. 04/10/18   Conan Bowens, MD  ondansetron (ZOFRAN ODT) 4 MG disintegrating tablet Take 1 tablet (4 mg total) by mouth every 8 (eight) hours as needed for nausea or vomiting. 04/10/18   Conan Bowens, MD    Allergies    Patient has no known allergies.  Review of Systems   Review of Systems  Musculoskeletal:  Positive for myalgias.  All other systems reviewed and are negative.  Physical Exam Updated  Vital Signs BP (!) 140/97 (BP Location: Left Arm)   Pulse 76   Temp 98.5 F (36.9 C) (Oral)   Resp 16   Ht 5\' 5"  (1.651 m)   Wt 62.1 kg   SpO2 100%   BMI 22.80 kg/m   Physical Exam Vitals and nursing note reviewed.  Constitutional:      General: She is not in acute distress.    Appearance: Normal appearance.  HENT:     Head: Normocephalic and atraumatic.  Eyes:     General:        Right eye: No discharge.        Left eye: No discharge.  Cardiovascular:     Rate and Rhythm: Normal rate and regular rhythm.  Pulmonary:     Effort: Pulmonary effort is normal. No respiratory distress.  Musculoskeletal:        General: Tenderness present. No deformity.     Comments: Significant tenderness to palpation without step-off or deformity in the right serratus muscle distribution.   There is no redness or evidence of vesicular eruption.  Increased pain with thoracic twisting.  Increased pain with elevation of the right arm.  Intact strength 5 out of 5 bilateral upper extremities.  Skin:    General: Skin is warm and dry.  Neurological:     Mental Status: She is alert and oriented to person, place, and time.  Psychiatric:        Mood and Affect: Mood normal.        Behavior: Behavior normal.    ED Results / Procedures / Treatments   Labs (all labs ordered are listed, but only abnormal results are displayed) Labs Reviewed - No data to display  EKG None  Radiology No results found.  Procedures Procedures   Medications Ordered in ED Medications - No data to display  ED Course  I have reviewed the triage vital signs and the nursing notes.  Pertinent labs & imaging results that were available during my care of the patient were reviewed by me and considered in my medical decision making (see chart for details).    MDM Rules/Calculators/A&P                         Overall well-appearing female with significant pain of the right rib cage secondary to lifting and twisting motion at her [employer] job.  Patient without fall to the affected area.  Upper extremities are neurovascularly intact.  Minimal clinical concern for rib fracture given nontraumatic mechanism, no step-off, no deformity.  Patient has tenderness to palpation that is isolated to the serratus muscle distribution on the right side.  Favor serratus muscle strain.  Discussed continued use of ibuprofen, Tylenol, will add topical lidocaine patch, muscle relaxant, and encourage follow-up with orthopedics if no improvement.  Discussed with patient that she should not fall asleep with her heating pad on her to prevent erythema ab igni.  She understands and agrees to plan, discharged in stable condition at this time.  Return precautions given. Final Clinical Impression(s) / ED Diagnoses Final diagnoses:  Muscle  strain of chest wall, initial encounter    Rx / DC Orders ED Discharge Orders          Ordered    methocarbamol (ROBAXIN) 500 MG tablet  2 times daily        11/10/21 1559    lidocaine (LIDODERM) 5 %  Every 24 hours  11/10/21 1559    ibuprofen (ADVIL) 800 MG tablet  3 times daily        11/10/21 1559             Kambri Dismore, Edyth Gunnels 11/10/21 1620    Wynetta Fines, MD 11/10/21 1904

## 2021-11-10 NOTE — ED Triage Notes (Signed)
Pt c/o right sided rib pain since Thursday. States the pain started when she was at work and believes she may have pulled a muscle.

## 2021-11-12 ENCOUNTER — Emergency Department (HOSPITAL_COMMUNITY)
Admission: EM | Admit: 2021-11-12 | Discharge: 2021-11-13 | Disposition: A | Discharge status: Home / Self Care | Payer: Self-pay | Attending: Emergency Medicine | Admitting: Emergency Medicine

## 2021-11-12 ENCOUNTER — Emergency Department (HOSPITAL_COMMUNITY): Payer: Self-pay

## 2021-11-12 ENCOUNTER — Encounter (HOSPITAL_COMMUNITY): Payer: Self-pay

## 2021-11-12 ENCOUNTER — Other Ambulatory Visit: Payer: Self-pay

## 2021-11-12 DIAGNOSIS — Z79899 Other long term (current) drug therapy: Secondary | ICD-10-CM | POA: Insufficient documentation

## 2021-11-12 DIAGNOSIS — J45909 Unspecified asthma, uncomplicated: Secondary | ICD-10-CM | POA: Insufficient documentation

## 2021-11-12 DIAGNOSIS — R0781 Pleurodynia: Secondary | ICD-10-CM | POA: Insufficient documentation

## 2021-11-12 NOTE — ED Provider Notes (Signed)
[facility]-EMERGENCY DEPT Provider Note   CSN: [id number] Arrival date & time: 11/12/21  2124     History Chief Complaint  Patient presents with   Rib Injury    Samantha Fowler is a 30 y.o. female.  HPI Patient is a 30 year old female with a medical history as noted below.  She presents to the emergency department for reevaluation of right rib pain.  Patient symptoms began 5 days ago and she was initially evaluated in the emergency department 2 days ago.  Previous provider felt the patient's exam is consistent with muscle strain and she was discharged on Robaxin, Lidoderm patches, as well as ibuprofen.  Patient states that she was given a work note for 2 days and taking her prescribed medications with relief.  She is back to work today and after performing more heavy lifting at work and also doing twisting motions and now she feels that her symptoms have worsened once again.  Pain worsens with any movement.  No chest pain, shortness of breath, fevers, chills, rash.  Patient does note an episode of abdominal pain earlier today after taking ibuprofen but states that this is since resolved.    Past Medical History:  Diagnosis Date   Anemia    Asthma    Endometriosis     Patient Active Problem List   Diagnosis Date Noted   PID (acute pelvic inflammatory disease) 04/10/2018   Chlamydia 04/09/2018   TOA (tubo-ovarian abscess) 04/08/2018    History reviewed. No pertinent surgical history.   OB History     Gravida  0   Para  0   Term  0   Preterm  0   AB  0   Living  0      SAB  0   IAB  0   Ectopic  0   Multiple  0   Live Births  0           History reviewed. No pertinent family history.  Social History   Tobacco Use   Smoking status: Never   Smokeless tobacco: Never  Vaping Use   Vaping Use: Never used  Substance Use Topics   Alcohol use: Yes   Drug use: Never    Home Medications Prior to Admission medications    Medication Sig Start Date End Date Taking? Authorizing Provider  amoxicillin-clavulanate (AUGMENTIN) 875-125 MG tablet Take 1 tablet by mouth every 12 (twelve) hours. 05/21/20   Horton, Mayer Masker, MD  cholecalciferol (VITAMIN D3) 25 MCG (1000 UNIT) tablet Take 1,000 Units by mouth daily.    [provider]  doxycycline (VIBRAMYCIN) 100 MG capsule Take 1 capsule (100 mg total) by mouth 2 (two) times daily. 04/10/18   Conan Bowens, MD  escitalopram (LEXAPRO) 10 MG tablet Take 10 mg by mouth daily.    [provider]  HYDROcodone-acetaminophen (NORCO/VICODIN) 5-325 MG tablet Take 1-2 tablets by mouth every 6 (six) hours as needed. 10/10/21   Benjiman Core, MD  ibuprofen (ADVIL) 800 MG tablet Take 1 tablet (800 mg total) by mouth 3 (three) times daily. 11/10/21   Prosperi, Christian H, PA-C  lidocaine (LIDODERM) 5 % Place 1 patch onto the skin daily. Remove & Discard patch within 12 hours or as directed by MD 11/10/21   Prosperi, Ephriam Knuckles H, PA-C  methocarbamol (ROBAXIN) 500 MG tablet Take 1 tablet (500 mg total) by mouth 2 (two) times daily. 11/10/21   Prosperi, Christian H, PA-C  metroNIDAZOLE (FLAGYL) 500 MG tablet Take  1 tablet (500 mg total) by mouth 2 (two) times daily. 04/10/18   Conan Bowens, MD  ondansetron (ZOFRAN ODT) 4 MG disintegrating tablet Take 1 tablet (4 mg total) by mouth every 8 (eight) hours as needed for nausea or vomiting. 04/10/18   Conan Bowens, MD    Allergies    Patient has no known allergies.  Review of Systems   Review of Systems  Constitutional:  Negative for chills and fever.  Gastrointestinal:  Negative for abdominal pain, nausea and vomiting.  Musculoskeletal:  Positive for arthralgias and myalgias.  Skin:  Negative for color change, rash and wound.   Physical Exam Updated Vital Signs BP 140/75 (BP Location: Left Arm)    Pulse 75    Temp 98 F (36.7 C) (Oral)    Resp 16    Ht 5\' 5"  (1.651 m)    Wt 62.1 kg    LMP 11/03/2021    SpO2  100%    BMI 22.80 kg/m   Physical Exam Vitals and nursing note reviewed.  Constitutional:      General: She is not in acute distress.    Appearance: Normal appearance. She is well-developed and normal weight.  HENT:     Head: Normocephalic and atraumatic.     Right Ear: External ear normal.     Left Ear: External ear normal.     Nose: Nose normal.  Eyes:     General: No scleral icterus.       Right eye: No discharge.        Left eye: No discharge.     Conjunctiva/sclera: Conjunctivae normal.  Neck:     Trachea: No tracheal deviation.  Cardiovascular:     Rate and Rhythm: Normal rate.  Pulmonary:     Effort: Pulmonary effort is normal. No respiratory distress.     Breath sounds: No stridor.  Abdominal:     General: Abdomen is flat. There is no distension.     Palpations: Abdomen is soft.     Tenderness: There is no abdominal tenderness.     Comments: Abdomen is flat, soft, and nontender.  Musculoskeletal:        General: Tenderness present. No swelling or deformity.     Cervical back: Neck supple.     Comments: Moderate tenderness noted diffusely along the right lateral ribs.  Additional moderate tenderness noted along the right thoracic musculature.  Skin:    General: Skin is warm and dry.     Findings: No rash.  Neurological:     General: No focal deficit present.     Mental Status: She is alert and oriented to person, place, and time.     Cranial Nerves: Cranial nerve deficit: no gross deficits.   ED Results / Procedures / Treatments   Labs (all labs ordered are listed, but only abnormal results are displayed) Labs Reviewed - No data to display  EKG None  Radiology DG Ribs Unilateral W/Chest Right  Result Date: 11/12/2021 CLINICAL DATA:  Right rib pain.  No known injury. EXAM: RIGHT RIBS AND CHEST - 3+ VIEW COMPARISON:  None. FINDINGS: No fracture or other bone lesions are seen involving the ribs. There is no evidence of pneumothorax or pleural effusion. Both  lungs are clear. Heart size and mediastinal contours are within normal limits. IMPRESSION: Negative. Electronically Signed   By: 11/14/2021 M.D.   On: 11/12/2021 23:50    Procedures Procedures   Medications Ordered in ED Medications - No data  to display  ED Course  I have reviewed the triage vital signs and the nursing notes.  Pertinent labs & imaging results that were available during my care of the patient were reviewed by me and considered in my medical decision making (see chart for details).    MDM Rules/Calculators/A&P                          Pt is a 30 y.o. female who presents to the emergency department for reevaluation of right rib pain.  Imaging: X-ray of the right ribs and chest are negative.  I, Placido Sou, PA-C, personally reviewed and evaluated these images and lab results as part of my medical decision-making.  Once again patient's symptoms appear to be musculoskeletal in nature.  Given the persistent pain I obtained x-rays at this visit which were negative.  Patient once again denies any developing rash.  No fevers or chills.  Will give patient an additional work note.  Discussed the RICE method.  Recommended continued use of ibuprofen as well as Robaxin.  Discussed return precautions.  Her questions were answered and she was amicable at the time of discharge.  Note: Portions of this report may have been transcribed using voice recognition software. Every effort was made to ensure accuracy; however, inadvertent computerized transcription errors may be present.   Final Clinical Impression(s) / ED Diagnoses Final diagnoses:  Rib pain on right side   Rx / DC Orders ED Discharge Orders     None        Placido Sou, PA-C 11/13/21 0001    Molpus, Jonny Ruiz, MD 11/13/21 7425

## 2021-11-12 NOTE — Discharge Instructions (Signed)
Please continue to take your prescribed medications as needed.  Please come back to the emergency department with any new or worsening symptoms.

## 2021-11-12 NOTE — ED Triage Notes (Signed)
Pt reports with right sided rib pain that continues on from last Thursday at work. Pt states that she was seen on Sunday but still cannot work without pain.

## 2022-05-30 ENCOUNTER — Ambulatory Visit
Admission: RE | Admit: 2022-05-30 | Discharge: 2022-05-30 | Disposition: A | Discharge status: Home / Self Care | Payer: Worker's Compensation | Source: Ambulatory Visit | Attending: Family Medicine | Admitting: Family Medicine

## 2022-05-30 ENCOUNTER — Other Ambulatory Visit: Payer: Self-pay | Admitting: Family Medicine

## 2022-05-30 DIAGNOSIS — R52 Pain, unspecified: Secondary | ICD-10-CM

## 2022-06-18 ENCOUNTER — Encounter (HOSPITAL_COMMUNITY): Payer: Self-pay

## 2022-06-18 ENCOUNTER — Emergency Department (HOSPITAL_COMMUNITY)
Admission: EM | Admit: 2022-06-18 | Discharge: 2022-06-18 | Disposition: A | Discharge status: Home / Self Care | Payer: BC Managed Care – PPO | Attending: Emergency Medicine | Admitting: Emergency Medicine

## 2022-06-18 ENCOUNTER — Emergency Department (HOSPITAL_COMMUNITY): Payer: BC Managed Care – PPO

## 2022-06-18 DIAGNOSIS — K4090 Unilateral inguinal hernia, without obstruction or gangrene, not specified as recurrent: Secondary | ICD-10-CM | POA: Diagnosis not present

## 2022-06-18 DIAGNOSIS — R1031 Right lower quadrant pain: Secondary | ICD-10-CM | POA: Diagnosis present

## 2022-06-18 LAB — URINALYSIS, ROUTINE W REFLEX MICROSCOPIC
Bilirubin Urine: NEGATIVE
Glucose, UA: NEGATIVE mg/dL
Hgb urine dipstick: NEGATIVE
Ketones, ur: NEGATIVE mg/dL
Nitrite: NEGATIVE
Protein, ur: NEGATIVE mg/dL
Specific Gravity, Urine: 1.017 (ref 1.005–1.030)
pH: 6 (ref 5.0–8.0)

## 2022-06-18 LAB — COMPREHENSIVE METABOLIC PANEL
ALT: 20 U/L (ref 0–44)
AST: 36 U/L (ref 15–41)
Albumin: 3.6 g/dL (ref 3.5–5.0)
Alkaline Phosphatase: 49 U/L (ref 38–126)
Anion gap: 7 (ref 5–15)
BUN: 7 mg/dL (ref 6–20)
CO2: 20 mmol/L — ABNORMAL LOW (ref 22–32)
Calcium: 8.4 mg/dL — ABNORMAL LOW (ref 8.9–10.3)
Chloride: 107 mmol/L (ref 98–111)
Creatinine, Ser: 0.8 mg/dL (ref 0.44–1.00)
GFR, Estimated: >60 mL/min (ref >60)
Glucose, Bld: 80 mg/dL (ref 70–99)
Potassium: 3.3 mmol/L — ABNORMAL LOW (ref 3.5–5.1)
Sodium: 134 mmol/L — ABNORMAL LOW (ref 135–145)
Total Bilirubin: 0.6 mg/dL (ref 0.3–1.2)
Total Protein: 7.6 g/dL (ref 6.5–8.1)

## 2022-06-18 LAB — CBC
HCT: 29 % — ABNORMAL LOW (ref 36.0–46.0)
Hemoglobin: 8.2 g/dL — ABNORMAL LOW (ref 12.0–15.0)
MCH: 23 pg — ABNORMAL LOW (ref 26.0–34.0)
MCHC: 28.3 g/dL — ABNORMAL LOW (ref 30.0–36.0)
MCV: 81.2 fL (ref 80.0–100.0)
Platelets: 254 10*3/uL (ref 150–400)
RBC: 3.57 MIL/uL — ABNORMAL LOW (ref 3.87–5.11)
RDW: 22 % — ABNORMAL HIGH (ref 11.5–15.5)
WBC: 3.2 10*3/uL — ABNORMAL LOW (ref 4.0–10.5)
nRBC: 0 % (ref 0.0–0.2)

## 2022-06-18 LAB — LIPASE, BLOOD: Lipase: 32 U/L (ref 11–51)

## 2022-06-18 LAB — I-STAT BETA HCG BLOOD, ED (MC, WL, AP ONLY): I-stat hCG, quantitative: <5 m[IU]/mL (ref <5)

## 2022-06-18 NOTE — ED Notes (Signed)
US at bedside

## 2022-06-18 NOTE — ED Notes (Signed)
Pelvic supplies to bedside.

## 2022-06-18 NOTE — ED Triage Notes (Signed)
Pt arrived via POV, c/o abd pain, nausea, diarrhea and states she noticed hard lump in groin area. Unsure if she could be pregnant. States hx of endometriosis

## 2022-06-18 NOTE — ED Provider Notes (Signed)
[facility]-EMERGENCY DEPT Provider Note   CSN: [id number] Arrival date & time: 06/18/22  0955     History  Chief Complaint  Patient presents with   Abdominal Pain    Samantha Fowler is a 31 y.o. female.  31 year old female presents with several days of constant right lower quadrant abdominal pain.  Patient states that it feels like she has a knot in that area.  Is concerned that she may be pregnant.  Denies any fever, emesis.  Has not had any diarrhea.  Has had no vaginal bleeding.  No increased discharge.  Does have history of PID in the past.       Home Medications Prior to Admission medications   Medication Sig Start Date End Date Taking? Authorizing Provider  amoxicillin-clavulanate (AUGMENTIN) 875-125 MG tablet Take 1 tablet by mouth every 12 (twelve) hours. 05/21/20   Horton, Mayer Masker, MD  cholecalciferol (VITAMIN D3) 25 MCG (1000 UNIT) tablet Take 1,000 Units by mouth daily.    [provider]  doxycycline (VIBRAMYCIN) 100 MG capsule Take 1 capsule (100 mg total) by mouth 2 (two) times daily. 04/10/18   Conan Bowens, MD  escitalopram (LEXAPRO) 10 MG tablet Take 10 mg by mouth daily.    [provider]  HYDROcodone-acetaminophen (NORCO/VICODIN) 5-325 MG tablet Take 1-2 tablets by mouth every 6 (six) hours as needed. 10/10/21   Benjiman Core, MD  ibuprofen (ADVIL) 800 MG tablet Take 1 tablet (800 mg total) by mouth 3 (three) times daily. 11/10/21   Prosperi, Christian H, PA-C  lidocaine (LIDODERM) 5 % Place 1 patch onto the skin daily. Remove & Discard patch within 12 hours or as directed by MD 11/10/21   Prosperi, Ephriam Knuckles H, PA-C  methocarbamol (ROBAXIN) 500 MG tablet Take 1 tablet (500 mg total) by mouth 2 (two) times daily. 11/10/21   Prosperi, Christian H, PA-C  metroNIDAZOLE (FLAGYL) 500 MG tablet Take 1 tablet (500 mg total) by mouth 2 (two) times daily. 04/10/18   Conan Bowens, MD  ondansetron (ZOFRAN ODT) 4 MG  disintegrating tablet Take 1 tablet (4 mg total) by mouth every 8 (eight) hours as needed for nausea or vomiting. 04/10/18   Conan Bowens, MD      Allergies    Patient has no known allergies.    Review of Systems   Review of Systems  All other systems reviewed and are negative.   Physical Exam Updated Vital Signs BP (!) 134/98 (BP Location: Right Arm)   Pulse 86   Temp 98.7 F (37.1 C) (Oral)   Resp 17   LMP 05/30/2022 (Exact Date)   SpO2 100%  Physical Exam Vitals and nursing note reviewed.  Constitutional:      General: She is not in acute distress.    Appearance: Normal appearance. She is well-developed. She is not toxic-appearing.  HENT:     Head: Normocephalic and atraumatic.  Eyes:     General: Lids are normal.     Conjunctiva/sclera: Conjunctivae normal.     Pupils: Pupils are equal, round, and reactive to light.  Neck:     Thyroid: No thyroid mass.     Trachea: No tracheal deviation.  Cardiovascular:     Rate and Rhythm: Normal rate and regular rhythm.     Heart sounds: Normal heart sounds. No murmur heard.    No gallop.  Pulmonary:     Effort: Pulmonary effort is normal. No respiratory distress.     Breath sounds: Normal  breath sounds. No stridor. No decreased breath sounds, wheezing, rhonchi or rales.  Abdominal:     General: There is no distension.     Palpations: Abdomen is soft.     Tenderness: There is abdominal tenderness in the right lower quadrant. There is no rebound.    Genitourinary:   Musculoskeletal:        General: No tenderness. Normal range of motion.     Cervical back: Normal range of motion and neck supple.  Skin:    General: Skin is warm and dry.     Findings: No abrasion or rash.  Neurological:     Mental Status: She is alert and oriented to person, place, and time. Mental status is at baseline.     GCS: GCS eye subscore is 4. GCS verbal subscore is 5. GCS motor subscore is 6.     Cranial Nerves: No cranial nerve deficit.      Sensory: No sensory deficit.     Motor: Motor function is intact.  Psychiatric:        Attention and Perception: Attention normal.        Speech: Speech normal.        Behavior: Behavior normal.     ED Results / Procedures / Treatments   Labs (all labs ordered are listed, but only abnormal results are displayed) Labs Reviewed  LIPASE, BLOOD  COMPREHENSIVE METABOLIC PANEL  CBC  URINALYSIS, ROUTINE W REFLEX MICROSCOPIC  I-STAT BETA HCG BLOOD, ED ([facility], [facility], [facility] ONLY)    EKG None  Radiology No results found.  Procedures Procedures    Medications Ordered in ED Medications - No data to display  ED Course/ Medical Decision Making/ A&P                           Medical Decision Making Amount and/or Complexity of Data Reviewed Labs: ordered. Radiology: ordered.  Labs here are reassuring.  Pelvic ultrasound per my interpretation shows no evidence of torsion. Patient has evidence inguinal hernia.  No signs of femoral hernia.  No signs of obstruction.  Patient has no vaginal bleed or discharge.  Plan with general surgery referral.  Patient agreeable to this.  No indication for pelvic exam at this time        Final Clinical Impression(s) / ED Diagnoses Final diagnoses:  None    Rx / DC Orders ED Discharge Orders     None         Lorre Nick, MD 06/18/22 1320

## 2022-07-15 ENCOUNTER — Ambulatory Visit: Payer: Self-pay | Admitting: Surgery

## 2022-07-15 NOTE — H&P (Signed)
Subjective  Chief Complaint: Inguinal Hernia (Right)     History of Present Illness: Samantha Fowler is a 31 y.o. female who is seen today as an office consultation at the request of Dr. Freida Busman for evaluation of Inguinal Hernia (Right) .    This is a 31 year old female who works at [employer] with much heavy lifting involved in her job who presents with a one month history of progressively worsening pain and swelling in her right groin.  She presented to the ED for evaluation.  Pelvic US was unremarkable but she was noted to have a right inguinal hernia on physical examination.  She denies any obstructive symptoms.  She has been unable to work because of the pain.  No CT imaging.   Review of Systems: A complete review of systems was obtained from the patient.  I have reviewed this information and discussed as appropriate with the patient.  See HPI as well for other ROS.  Review of Systems  Constitutional: Negative.   HENT: Negative.    Eyes: Negative.   Respiratory: Negative.    Cardiovascular: Negative.   Gastrointestinal:  Positive for abdominal pain.  Genitourinary: Negative.   Musculoskeletal: Negative.   Skin: Negative.   Neurological: Negative.   Endo/Heme/Allergies: Negative.   Psychiatric/Behavioral: Negative.       Medical History: Past Medical History: Diagnosis Date  Anemia   Asthma, unspecified asthma severity, unspecified whether complicated, unspecified whether persistent    Patient Active Problem List Diagnosis  Non-recurrent unilateral inguinal hernia without obstruction or gangrene   History reviewed. No pertinent surgical history.   No Known Allergies  No current outpatient medications on file prior to visit.  No current facility-administered medications on file prior to visit.   Family History Problem Relation Age of Onset  Obesity Mother   High blood pressure (Hypertension) Mother     Social History  Tobacco Use Smoking Status Some  Days  Types: Cigarettes Smokeless Tobacco Never    Social History  Socioeconomic History  Marital status: Single Tobacco Use  Smoking status: Some Days   Types: Cigarettes  Smokeless tobacco: Never Vaping Use  Vaping Use: Never used Substance and Sexual Activity  Alcohol use: Yes  Drug use: Never   Objective:   Vitals:  07/15/22 1402 BP: 120/72 Pulse: 85 Temp: 37.6 C (99.6 F) SpO2: 99% Weight: 64 kg (141 lb) Height: 162.6 cm (5\' 4" )   Body mass index is 24.2 kg/m.  Physical Exam   Constitutional:  WDWN in NAD, conversant, no obvious deformities; lying in bed comfortably Eyes:  Pupils equal, round; sclera anicteric; moist conjunctiva; no lid lag HENT:  Oral mucosa moist; good dentition  Neck:  No masses palpated, trachea midline; no thyromegaly Lungs:  CTA bilaterally; normal respiratory effort CV:  Regular rate and rhythm; no murmurs; extremities well-perfused with no edema Abd:  +bowel sounds, soft, non-tender, no palpable organomegaly; palpable reducible inguinal hernia on the right - no sign of left inguinal hernia Musc:  Normal gait; no apparent clubbing or cyanosis in extremities Lymphatic:  No palpable cervical or axillary lymphadenopathy Skin:  Warm, dry; no sign of jaundice Psychiatric - alert and oriented x 4; calm mood and affect    Assessment and Plan: Diagnoses and all orders for this visit:  Non-recurrent unilateral inguinal hernia without obstruction or gangrene     Recommend right inguinal hernia repair with mesh.  The surgical procedure has been discussed with the patient.  Potential risks, benefits, alternative treatments, and expected  outcomes have been explained.  All of the patient's questions at this time have been answered.  The likelihood of reaching the patient's treatment goal is good.  The patient understand the proposed surgical procedure and wishes to proceed.  She will be out of work until surgery and two weeks after surgery.   She will need to be on light duty for four more weeks after that before returning to full duty.  No follow-ups on file.  Lissa Morales, MD  07/15/2022 8:46 PM

## 2022-10-12 ENCOUNTER — Emergency Department (HOSPITAL_COMMUNITY)
Admission: EM | Admit: 2022-10-12 | Discharge: 2022-10-13 | Discharge status: Against Medical Advice | Payer: BC Managed Care – PPO | Source: Home / Self Care

## 2022-10-13 ENCOUNTER — Ambulatory Visit
Admission: EM | Admit: 2022-10-13 | Discharge: 2022-10-13 | Disposition: A | Discharge status: Home / Self Care | Payer: BC Managed Care – PPO | Attending: Emergency Medicine | Admitting: Emergency Medicine

## 2022-10-13 DIAGNOSIS — N912 Amenorrhea, unspecified: Secondary | ICD-10-CM | POA: Diagnosis not present

## 2022-10-13 DIAGNOSIS — N809 Endometriosis, unspecified: Secondary | ICD-10-CM

## 2022-10-13 DIAGNOSIS — D509 Iron deficiency anemia, unspecified: Secondary | ICD-10-CM

## 2022-10-13 DIAGNOSIS — R519 Headache, unspecified: Secondary | ICD-10-CM

## 2022-10-13 DIAGNOSIS — J309 Allergic rhinitis, unspecified: Secondary | ICD-10-CM | POA: Diagnosis not present

## 2022-10-13 LAB — POCT URINE PREGNANCY: Preg Test, Ur: NEGATIVE

## 2022-10-13 MED ORDER — IBUPROFEN 600 MG PO TABS: 600.0000 mg | ORAL_TABLET | Freq: Three times a day (TID) | ORAL | 0 refills | Status: DC | PRN

## 2022-10-13 MED ORDER — FLUTICASONE PROPIONATE 50 MCG/ACT NA SUSP: 1.0000 | Freq: Every day | NASAL | 2 refills | Status: AC

## 2022-10-13 MED ORDER — CETIRIZINE HCL 10 MG PO TABS: 10.0000 mg | ORAL_TABLET | Freq: Every day | ORAL | 1 refills | Status: AC

## 2022-10-13 NOTE — Discharge Instructions (Signed)
The result of your blood test will be available to your MyChart once they are complete.  If there are any abnormal findings, you will be contacted by phone with recommendations for treatment if any are needed.  For your allergies, please begin Zyrtec and Flonase as prescribed.  Please continue taking them throughout the end of the fall season.  If you find that your symptoms return after you discontinue them, please feel free to resume them.  For your headache, please take ibuprofen 600 mg every 6-8 hours as needed.  For menstrual pain, please begin taking naproxen 500 mg twice daily a few days before you expect your period to begin.  Continue taking naproxen twice daily throughout your entire period until it is resolved.  Please reach out to Dr. Delford Field to schedule an appointment for follow-up of your endometriosis and missed menstrual cycle.  Thank you for visiting urgent care today.

## 2022-10-13 NOTE — ED Provider Notes (Signed)
[facility]    CSN: [id number] Arrival date & time: 10/13/22  1540    HISTORY   Chief Complaint  Patient presents with   Headache   Amenorrhea   HPI Samantha Fowler is a pleasant, 31 y.o. female who presents to urgent care today. Patient complains of a 3-week history of headache and light sensitivity.  Patient states her headache has been waxing and waning, varies throughout the day, does respond to Tylenol and Goody powders but returns a few hours after taking.  Patient reports history of migraines but is never has had a headache to last this long.  Patient states she missed her period during the month of October, first day of last menstrual period was September 23.  Patient reports multiple negative home pregnancy test.  Urine pregnancy test performed today in the clinic was also negative.  Patient states has been taking Tylenol and Goody powders for her headaches without meaningful relief.  Patient has elevated blood pressure at this time.  Patient reports a history of iron deficiency anemia, not currently taking any iron supplementation.  Patient states she does not recall the last time she had her blood counts or iron level checked.  The history is provided by the patient.   Past Medical History:  Diagnosis Date   Anemia    Asthma    Endometriosis    Patient Active Problem List   Diagnosis Date Noted   PID (acute pelvic inflammatory disease) 04/10/2018   Chlamydia 04/09/2018   TOA (tubo-ovarian abscess) 04/08/2018   History reviewed. No pertinent surgical history. OB History     Gravida  0   Para  0   Term  0   Preterm  0   AB  0   Living  0      SAB  0   IAB  0   Ectopic  0   Multiple  0   Live Births  0          Home Medications    Prior to Admission medications   Medication Sig Start Date End Date Taking? Authorizing Provider  cetirizine (ZYRTEC ALLERGY) 10 MG tablet Take 1 tablet (10 mg total) by mouth at bedtime. 10/13/22  04/11/23 Yes Lynden Oxford Scales, PA-C  fluticasone (FLONASE) 50 MCG/ACT nasal spray Place 1 spray into both nostrils daily. Begin by using 2 sprays in each nare daily for 3 to 5 days, then decrease to 1 spray in each nare daily. 10/13/22  Yes Lynden Oxford Scales, PA-C  ibuprofen (ADVIL) 600 MG tablet Take 1 tablet (600 mg total) by mouth every 8 (eight) hours as needed for up to 30 doses for fever, headache, mild pain or moderate pain (Inflammation). Take 1 tablet 3 times daily as needed for inflammation of upper airways and/or pain. 10/13/22  Yes Lynden Oxford Scales, PA-C    Family History History reviewed. No pertinent family history. Social History Social History   Tobacco Use   Smoking status: Never   Smokeless tobacco: Never  Vaping Use   Vaping Use: Never used  Substance Use Topics   Alcohol use: Yes   Drug use: Never   Allergies   Patient has no known allergies.  Review of Systems Review of Systems Pertinent findings revealed after performing a 14 point review of systems has been noted in the history of present illness.  Physical Exam Triage Vital Signs ED Triage Vitals  Enc Vitals Group     BP 09/27/21 0827 (!) 147/82  Pulse Rate 09/27/21 0827 72     Resp 09/27/21 0827 18     Temp 09/27/21 0827 98.3 F (36.8 C)     Temp Source 09/27/21 0827 Oral     SpO2 09/27/21 0827 98 %     Weight --      Height --      Head Circumference --      Peak Flow --      Pain Score 09/27/21 0826 5     Pain Loc --      Pain Edu? --      Excl. in GC? --   No data found.  Updated Vital Signs BP (!) 157/91 (BP Location: Right Arm)   Pulse 62   Temp 97.9 F (36.6 C) (Oral)   Resp 16   LMP 08/23/2022   SpO2 99%   Physical Exam Vitals and nursing note reviewed.  Constitutional:      General: She is not in acute distress.    Appearance: Normal appearance. She is not ill-appearing.  HENT:     Head: Normocephalic and atraumatic.     Salivary Glands: Right salivary  gland is not diffusely enlarged or tender. Left salivary gland is not diffusely enlarged or tender.     Right Ear: Ear canal and external ear normal. No drainage. No middle ear effusion. There is no impacted cerumen. Tympanic membrane is bulging. Tympanic membrane is not erythematous.     Left Ear: Ear canal and external ear normal. No drainage.  No middle ear effusion. There is no impacted cerumen. Tympanic membrane is bulging. Tympanic membrane is not erythematous.     Ears:     Comments: Bilateral TMs bulging with clear fluid    Nose: Rhinorrhea present. No nasal deformity, septal deviation, mucosal edema or congestion. Rhinorrhea is clear.     Right Turbinates: Enlarged, swollen and pale.     Left Turbinates: Enlarged, swollen and pale.     Right Sinus: No maxillary sinus tenderness or frontal sinus tenderness.     Left Sinus: No maxillary sinus tenderness or frontal sinus tenderness.     Mouth/Throat:     Lips: Pink. No lesions.     Mouth: Mucous membranes are moist. No oral lesions.     Pharynx: Oropharynx is clear. Uvula midline. No posterior oropharyngeal erythema or uvula swelling.     Tonsils: No tonsillar exudate. 0 on the right. 0 on the left.  Eyes:     General: Lids are normal.        Right eye: No discharge.        Left eye: No discharge.     Extraocular Movements: Extraocular movements intact.     Conjunctiva/sclera: Conjunctivae normal.     Right eye: Right conjunctiva is not injected.     Left eye: Left conjunctiva is not injected.     Pupils: Pupils are equal, round, and reactive to light.  Neck:     Trachea: Trachea and phonation normal.  Cardiovascular:     Rate and Rhythm: Normal rate and regular rhythm.     Pulses: Normal pulses.     Heart sounds: Normal heart sounds. No murmur heard.    No friction rub. No gallop.  Pulmonary:     Effort: Pulmonary effort is normal. No accessory muscle usage, prolonged expiration or respiratory distress.     Breath sounds:  Normal breath sounds. No stridor, decreased air movement or transmitted upper airway sounds. No decreased breath sounds, wheezing, rhonchi or  rales.  Chest:     Chest wall: No tenderness.  Musculoskeletal:        General: Normal range of motion.     Cervical back: Normal range of motion and neck supple. Normal range of motion.  Lymphadenopathy:     Cervical: No cervical adenopathy.  Skin:    General: Skin is warm and dry.     Findings: No erythema or rash.  Neurological:     General: No focal deficit present.     Mental Status: She is alert and oriented to person, place, and time. Mental status is at baseline.  Psychiatric:        Mood and Affect: Mood normal.        Behavior: Behavior normal.        Thought Content: Thought content normal.        Judgment: Judgment normal.     Visual Acuity Right Eye Distance:   Left Eye Distance:   Bilateral Distance:    Right Eye Near:   Left Eye Near:    Bilateral Near:     [facility] Couse / Diagnostics / Procedures:     Radiology No results found.  Procedures Procedures (including critical care time) EKG  Pending results:  Labs Reviewed  CBC - Abnormal; Notable for the following components:      Result Value   RBC 3.54 (*)    Hemoglobin 8.6 (*)    Hematocrit 30.5 (*)    MCH 24.3 (*)    MCHC 28.2 (*)    RDW 21.9 (*)    All other components within normal limits   Narrative:    Performed at:  [address] [place], [place]  [id number] Lab Director: Rush Farmer MD, Phone:  [phone]  IRON AND TIBC - Abnormal; Notable for the following components:   Iron 13 (*)    Iron Saturation 4 (*)    All other components within normal limits   Narrative:    Performed at:  [address] [place], [place]  [id number] Lab Director: Rush Farmer MD, Phone:  [phone]  BETA HCG QUANT (REF LAB)   Narrative:    Performed at:  [facility] [address] [place], [place]   [id number] Lab Director: Rush Farmer MD, Phone:  [phone]  TSH   Narrative:    Performed at:  [facility] [address] [place], [place]  [id number] Lab Director: Rush Farmer MD, Phone:  [phone]  POCT URINE PREGNANCY    Medications Ordered in UC: Medications - No data to display  UC Diagnoses / Final Clinical Impressions(s)   I have reviewed the triage vital signs and the nursing notes.  Pertinent labs & imaging results that were available during my care of the patient were reviewed by me and considered in my medical decision making (see chart for details).    Final diagnoses:  Amenorrhea, unspecified  Iron deficiency anemia, unspecified iron deficiency anemia type  Endometriosis  Allergic rhinitis, unspecified seasonality, unspecified trigger  Acute nonintractable headache, unspecified headache type   Check patient's thyroid level, iron level, CBC and serum hCG.  Patient is not pregnant and has normal thyroid function.  Patient is significantly anemic with a H/H of 8.6 and 30.5.  Patient's iron level is severely low, patient was advised to begin iron supplementation at this time and to follow-up with primary care.  Patient also advised that anemia and iron deficiency can contribute to significant headaches  as can elevated blood pressure which actually may be secondary to her iron deficiency anemia.  Patient further advised of her physical exam findings concerning for allergic rhinitis, patient provided with prescription for Flonase and Zyrtec.  Patient advised that she can continue ibuprofen as needed for headache.  ED Prescriptions     Medication Sig Dispense Auth. Provider   fluticasone (FLONASE) 50 MCG/ACT nasal spray Place 1 spray into both nostrils daily. Begin by using 2 sprays in each nare daily for 3 to 5 days, then decrease to 1 spray in each nare daily. 15.8 mL Lynden Oxford Scales, PA-C   cetirizine (ZYRTEC ALLERGY) 10 MG tablet Take 1 tablet (10  mg total) by mouth at bedtime. 90 tablet Lynden Oxford Scales, PA-C   ibuprofen (ADVIL) 600 MG tablet Take 1 tablet (600 mg total) by mouth every 8 (eight) hours as needed for up to 30 doses for fever, headache, mild pain or moderate pain (Inflammation). Take 1 tablet 3 times daily as needed for inflammation of upper airways and/or pain. 30 tablet Lynden Oxford Scales, PA-C      PDMP not reviewed this encounter.  Pending results:  Labs Reviewed  CBC - Abnormal; Notable for the following components:      Result Value   RBC 3.54 (*)    Hemoglobin 8.6 (*)    Hematocrit 30.5 (*)    MCH 24.3 (*)    MCHC 28.2 (*)    RDW 21.9 (*)    All other components within normal limits   Narrative:    Performed at:  [address], [place], [place]  [id number] Lab Director: Rush Farmer MD, Phone:  [phone]  IRON AND TIBC - Abnormal; Notable for the following components:   Iron 13 (*)    Iron Saturation 4 (*)    All other components within normal limits   Narrative:    Performed at:  [address], [place], [place]  [id number] Lab Director: Rush Farmer MD, Phone:  [phone]  BETA HCG QUANT (REF LAB)   Narrative:    Performed at:  [facility] [address] [place], [place]  [id number] Lab Director: Rush Farmer MD, Phone:  [phone]  TSH   Narrative:    Performed at:  [facility] [address] [place], [place]  [id number] Lab Director: Rush Farmer MD, Phone:  [phone]  POCT URINE PREGNANCY    Discharge Instructions:   Discharge Instructions      The result of your blood test will be available to your MyChart once they are complete.  If there are any abnormal findings, you will be contacted by phone with recommendations for treatment if any are needed.  For your allergies, please begin Zyrtec and Flonase as prescribed.  Please continue taking them throughout the end of the fall season.  If you  find that your symptoms return after you discontinue them, please feel free to resume them.  For your headache, please take ibuprofen 600 mg every 6-8 hours as needed.  For menstrual pain, please begin taking naproxen 500 mg twice daily a few days before you expect your period to begin.  Continue taking naproxen twice daily throughout your entire period until it is resolved.  Please reach out to Dr. Tacy Dura to schedule an appointment for follow-up of your endometriosis and missed menstrual cycle.  Thank you for visiting urgent care today.       Disposition Upon Discharge:  Condition:  stable for discharge home  Patient presented with an acute illness with associated systemic symptoms and significant discomfort requiring urgent management. In my opinion, this is a condition that a prudent lay person (someone who possesses an average knowledge of health and medicine) may potentially expect to result in complications if not addressed urgently such as respiratory distress, impairment of bodily function or dysfunction of bodily organs.   Routine symptom specific, illness specific and/or disease specific instructions were discussed with the patient and/or caregiver at length.   As such, the patient has been evaluated and assessed, work-up was performed and treatment was provided in alignment with urgent care protocols and evidence based medicine.  Patient/parent/caregiver has been advised that the patient may require follow up for further testing and treatment if the symptoms continue in spite of treatment, as clinically indicated and appropriate.  Patient/parent/caregiver has been advised to return to the [facility] or PCP if no better; to PCP or the Emergency Department if new signs and symptoms develop, or if the current signs or symptoms continue to change or worsen for further workup, evaluation and treatment as clinically indicated and appropriate  The patient will follow up with their  current PCP if and as advised. If the patient does not currently have a PCP we will assist them in obtaining one.   The patient may need specialty follow up if the symptoms continue, in spite of conservative treatment and management, for further workup, evaluation, consultation and treatment as clinically indicated and appropriate.   Patient/parent/caregiver verbalized understanding and agreement of plan as discussed.  All questions were addressed during visit.  Please see discharge instructions below for further details of plan.  This office note has been dictated using Museum/gallery curator.  Unfortunately, this method of dictation can sometimes lead to typographical or grammatical errors.  I apologize for your inconvenience in advance if this occurs.  Please do not hesitate to reach out to me if clarification is needed.      Lynden Oxford Scales, PA-C 10/16/22 1201

## 2022-10-13 NOTE — ED Triage Notes (Signed)
Pt c/o headache for x3 weeks, the patient states she does have light sensitivity.   The patient states she has missed her menstrual cycle.  LMP: 9/23  Home interventions: tylenol, goody powder

## 2022-10-15 ENCOUNTER — Telehealth (HOSPITAL_COMMUNITY): Payer: Self-pay | Admitting: Emergency Medicine

## 2022-10-15 LAB — CBC
Hematocrit: 30.5 % — ABNORMAL LOW (ref 34.0–46.6)
Hemoglobin: 8.6 g/dL — ABNORMAL LOW (ref 11.1–15.9)
MCH: 24.3 pg — ABNORMAL LOW (ref 26.6–33.0)
MCHC: 28.2 g/dL — ABNORMAL LOW (ref 31.5–35.7)
MCV: 86 fL (ref 79–97)
Platelets: 302 10*3/uL (ref 150–450)
RBC: 3.54 x10E6/uL — ABNORMAL LOW (ref 3.77–5.28)
RDW: 21.9 % — ABNORMAL HIGH (ref 11.7–15.4)
WBC: 4.6 10*3/uL (ref 3.4–10.8)

## 2022-10-15 LAB — IRON AND TIBC
Iron Saturation: 4 % — CL (ref 15–55)
Iron: 13 ug/dL — ABNORMAL LOW (ref 27–159)
Total Iron Binding Capacity: 370 ug/dL (ref 250–450)
UIBC: 357 ug/dL (ref 131–425)

## 2022-10-15 LAB — TSH: TSH: 0.789 u[IU]/mL (ref 0.450–4.500)

## 2022-10-15 LAB — BETA HCG QUANT (REF LAB): hCG Quant: <1 m[IU]/mL

## 2022-10-15 MED ORDER — FERROUS SULFATE 325 (65 FE) MG PO TABS: 325.0000 mg | ORAL_TABLET | Freq: Every day | ORAL | 0 refills | Status: AC

## 2022-10-15 MED ORDER — SENNA 8.6 MG PO TABS: 1.0000 | ORAL_TABLET | Freq: Every day | ORAL | 0 refills | Status: AC

## 2023-05-19 ENCOUNTER — Emergency Department (HOSPITAL_COMMUNITY)
Admission: EM | Admit: 2023-05-19 | Discharge: 2023-05-20 | Disposition: A | Discharge status: Home / Self Care | Payer: BC Managed Care – PPO | Attending: Emergency Medicine | Admitting: Emergency Medicine

## 2023-05-19 ENCOUNTER — Emergency Department (HOSPITAL_COMMUNITY): Payer: BC Managed Care – PPO

## 2023-05-19 ENCOUNTER — Encounter (HOSPITAL_COMMUNITY): Payer: Self-pay | Admitting: Emergency Medicine

## 2023-05-19 DIAGNOSIS — Z7951 Long term (current) use of inhaled steroids: Secondary | ICD-10-CM | POA: Diagnosis not present

## 2023-05-19 DIAGNOSIS — W2209XA Striking against other stationary object, initial encounter: Secondary | ICD-10-CM | POA: Insufficient documentation

## 2023-05-19 DIAGNOSIS — J45909 Unspecified asthma, uncomplicated: Secondary | ICD-10-CM | POA: Diagnosis not present

## 2023-05-19 DIAGNOSIS — S61411A Laceration without foreign body of right hand, initial encounter: Secondary | ICD-10-CM | POA: Diagnosis not present

## 2023-05-19 DIAGNOSIS — S6991XA Unspecified injury of right wrist, hand and finger(s), initial encounter: Secondary | ICD-10-CM | POA: Diagnosis present

## 2023-05-19 DIAGNOSIS — S62336B Displaced fracture of neck of fifth metacarpal bone, right hand, initial encounter for open fracture: Secondary | ICD-10-CM | POA: Diagnosis not present

## 2023-05-19 NOTE — ED Triage Notes (Addendum)
Pt hit hand on dresser and lacerated R knuckle at finger joint. Difficulty moving it.  Up to date on tetanus. Pt smells of ETOH

## 2023-05-20 ENCOUNTER — Other Ambulatory Visit: Payer: Self-pay

## 2023-05-20 MED ORDER — NAPROXEN 375 MG PO TABS: ORAL_TABLET | ORAL | 0 refills | Status: AC

## 2023-05-20 MED ORDER — FLUCONAZOLE 150 MG PO TABS: ORAL_TABLET | ORAL | 0 refills | Status: AC

## 2023-05-20 MED ORDER — CEPHALEXIN 500 MG PO CAPS: 500.0000 mg | ORAL_CAPSULE | Freq: Four times a day (QID) | ORAL | 0 refills | Status: AC

## 2023-05-20 MED ORDER — KETOROLAC TROMETHAMINE 15 MG/ML IJ SOLN
15.0000 mg | Freq: Once | INTRAMUSCULAR | Status: AC
  Administered 2023-05-20: 15 mg via INTRAVENOUS
  Filled 2023-05-20: qty 1

## 2023-05-20 MED ORDER — LIDOCAINE-EPINEPHRINE (PF) 2 %-1:200000 IJ SOLN
10.0000 mL | Freq: Once | INTRAMUSCULAR | Status: AC
  Administered 2023-05-20: 10 mL via INTRADERMAL
  Filled 2023-05-20: qty 20

## 2023-05-20 MED ORDER — CEFAZOLIN SODIUM-DEXTROSE 1-4 GM/50ML-% IV SOLN
1.0000 g | Freq: Once | INTRAVENOUS | Status: AC
  Administered 2023-05-20: 1 g via INTRAVENOUS
  Filled 2023-05-20: qty 50

## 2023-05-20 NOTE — ED Provider Notes (Signed)
[facility] Provider Note: Lowella Dell, MD, FACEP  CSN: [id number] MRN: [id number] ARRIVAL: 05/19/23 at 2217 ROOM: WA09/WA09   CHIEF COMPLAINT  Hand Injury   HISTORY OF PRESENT ILLNESS  05/20/23 12:17 AM Samantha Fowler is a 32 y.o. female was playing with her dogs yesterday evening and she struck her right hand on the court of the dresser.  She has a laceration over the right fifth metacarpophalangeal joint.  She has negligible sensation in the right fourth and fifth fingers.  She has decreased movement of the right fourth and fifth fingers.  She is not having significant pain at the present time.  Tetanus immunization is up-to-date.   Past Medical History:  Diagnosis Date   Anemia    Asthma    Endometriosis     History reviewed. No pertinent surgical history.  History reviewed. No pertinent family history.  Social History   Tobacco Use   Smoking status: Never   Smokeless tobacco: Never  Vaping Use   Vaping Use: Never used  Substance Use Topics   Alcohol use: Yes   Drug use: Never    Prior to Admission medications   Medication Sig Start Date End Date Taking? Authorizing Provider  cephALEXin (KEFLEX) 500 MG capsule Take 1 capsule (500 mg total) by mouth 4 (four) times daily. 05/20/23  Yes Yuktha Kerchner, MD  fluconazole (DIFLUCAN) 150 MG tablet Take 1 tablet as needed for vaginal yeast infection.  May repeat in 3 days if symptoms persist. 05/20/23  Yes Shun Pletz, MD  naproxen (NAPROSYN) 375 MG tablet Take 1 tablet twice daily as needed for pain. 05/20/23  Yes Mckensi Redinger, MD  cetirizine (ZYRTEC ALLERGY) 10 MG tablet Take 1 tablet (10 mg total) by mouth at bedtime. 10/13/22 04/11/23  Theadora Rama Scales, PA-C  ferrous sulfate 325 (65 FE) MG tablet Take 1 tablet (325 mg total) by mouth daily. 10/15/22   Merrilee Jansky, MD  fluticasone (FLONASE) 50 MCG/ACT nasal spray Place 1 spray into both nostrils daily. Begin by using 2 sprays in each nare daily for 3 to  5 days, then decrease to 1 spray in each nare daily. 10/13/22   Theadora Rama Scales, PA-C    Allergies Patient has no known allergies.   REVIEW OF SYSTEMS  Negative except as noted here or in the History of Present Illness.   PHYSICAL EXAMINATION  Initial Vital Signs Blood pressure (!) 142/87, pulse 83, temperature 98.2 F (36.8 C), temperature source Oral, resp. rate 18, last menstrual period 05/19/2023, SpO2 100 %.  Examination General: Well-developed, well-nourished female in no acute distress; appearance consistent with age of record HENT: normocephalic; atraumatic Eyes: Normal appearance Neck: supple Heart: regular rate and rhythm Lungs: clear to auscultation bilaterally Abdomen: soft; nondistended; nontender; bowel sounds present Extremities: Laceration and mild deformity of the dorsal aspect of the right fifth metacarpophalangeal joint, insensate right fourth and fifth fingers, reduced range of motion of right fourth and fifth fingers:    Neurologic: Awake, alert and oriented; motor function intact in all extremities and symmetric; no facial droop Skin: Warm and dry Psychiatric: Normal mood and affect   RESULTS  Summary of this visit's results, reviewed and interpreted by myself:   EKG Interpretation  Date/Time:    Ventricular Rate:    PR Interval:    QRS Duration:   QT Interval:    QTC Calculation:   R Axis:     Text Interpretation:         Laboratory Studies:  No results found for this or any previous visit (from the past 24 hour(s)). Imaging Studies: DG Hand Complete Right  Result Date: 05/19/2023 CLINICAL DATA:  Laceration. Hit hand on dresser and lacerated right knuckle at finger joint. EXAM: RIGHT HAND - COMPLETE 3+ VIEW COMPARISON:  None Available. FINDINGS: Acute mildly displaced fracture of the distal fifth metacarpal. Apex dorsal/ulnar angulation. Soft tissue swelling. Laceration at the base of the fifth finger. IMPRESSION: Acute mildly  displaced fracture of the distal fifth metacarpal. Electronically Signed   By: Minerva Fester M.D.   On: 05/19/2023 23:05    ED COURSE and MDM  Nursing notes, initial and subsequent vitals signs, including pulse oximetry, reviewed and interpreted by myself.  Vitals:   05/19/23 2244  BP: (!) 142/87  Pulse: 83  Resp: 18  Temp: 98.2 F (36.8 C)  TempSrc: Oral  SpO2: 100%   Medications  ceFAZolin (ANCEF) IVPB 1 g/50 mL premix (1 g Intravenous New Bag/Given 05/20/23 0104)  lidocaine-EPINEPHrine (XYLOCAINE W/EPI) 2 %-1:200000 (PF) injection 10 mL (10 mLs Intradermal Given by Other 05/20/23 0126)  ketorolac (TORADOL) 15 MG/ML injection 15 mg (15 mg Intravenous Given 05/20/23 0103)   12:47 AM Discussed with Dr. Janee Morn of hand surgery.  He would like the patient to receive antibiotics (Ancef 1 g already ordered) and have the wound closed.  His office will contact her later today regarding follow-up and possible surgery.  1:35 AM We will dress closed wound and place in ulnar gutter.  PROCEDURES  Procedures LACERATION REPAIR Performed by: Carlisle Beers Cheyla Duchemin Authorized by: Carlisle Beers Peighton Mehra Consent: Verbal consent obtained. Risks and benefits: risks, benefits and alternatives were discussed Consent given by: patient Patient identity confirmed: provided demographic data Prepped and Draped in normal sterile fashion Wound explored  Laceration Location: Right hand  Laceration Length: 2.5 cm  No Foreign Bodies seen or palpated  Anesthesia: local infiltration  Local anesthetic: lidocaine 2% with epinephrine  Anesthetic total: 6 ml  Irrigation method: syringe Amount of cleaning: standard  Skin closure: 4-0 Prolene  Number of sutures: 5  Technique: Simple interrupted  Patient tolerance: Patient tolerated the procedure well with no immediate complications.    ED DIAGNOSES     ICD-10-CM   1. Open displaced fracture of neck of fifth metacarpal bone of right hand, initial encounter   S62.336B          Abhimanyu Cruces, Jonny Ruiz, MD 05/20/23 0139

## 2023-05-20 NOTE — Progress Notes (Signed)
Orthopedic Tech Progress Note Patient Details:  Samantha Fowler 1991-07-30 [id number]  Ortho Devices Type of Ortho Device: Short arm splint, Sling immobilizer Ortho Device/Splint Location: rue Ortho Device/Splint Interventions: Ordered, Application, Adjustment   Post Interventions Patient Tolerated: Well  Al Decant 05/20/2023, 2:05 AM

## 2023-05-20 NOTE — ED Notes (Signed)
Ortho tech called for splint.

## 2023-05-20 NOTE — ED Notes (Signed)
Ortho tech here 

## 2023-05-20 NOTE — Discharge Instructions (Addendum)
Your fracture (broken bone) will likely need surgery.  Dr. Carollee Massed office is supposed to call you to arrange follow-up.  If you do not hear from them please call the office to make an appointment.  You have an open fracture of the right fifth metacarpal.

## 2023-05-23 ENCOUNTER — Other Ambulatory Visit: Payer: Self-pay

## 2023-05-23 ENCOUNTER — Emergency Department (HOSPITAL_COMMUNITY)
Admission: EM | Admit: 2023-05-23 | Discharge: 2023-05-23 | Disposition: A | Discharge status: Home / Self Care | Payer: BC Managed Care – PPO | Attending: Emergency Medicine | Admitting: Emergency Medicine

## 2023-05-23 ENCOUNTER — Emergency Department (HOSPITAL_COMMUNITY): Payer: BC Managed Care – PPO

## 2023-05-23 DIAGNOSIS — Z4800 Encounter for change or removal of nonsurgical wound dressing: Secondary | ICD-10-CM | POA: Diagnosis present

## 2023-05-23 DIAGNOSIS — M7989 Other specified soft tissue disorders: Secondary | ICD-10-CM | POA: Insufficient documentation

## 2023-05-23 DIAGNOSIS — Z5189 Encounter for other specified aftercare: Secondary | ICD-10-CM

## 2023-05-23 MED ORDER — HYDROCODONE-ACETAMINOPHEN 5-325 MG PO TABS
1.0000 | ORAL_TABLET | Freq: Once | ORAL | Status: AC
  Administered 2023-05-23: 1 via ORAL
  Filled 2023-05-23: qty 1

## 2023-05-23 MED ORDER — OXYCODONE HCL 5 MG PO TABS
5.0000 mg | ORAL_TABLET | Freq: Once | ORAL | Status: AC
  Administered 2023-05-23: 5 mg via ORAL
  Filled 2023-05-23: qty 1

## 2023-05-23 MED ORDER — OXYCODONE HCL 5 MG PO TABS: 5.0000 mg | ORAL_TABLET | Freq: Four times a day (QID) | ORAL | 0 refills | Status: AC | PRN

## 2023-05-23 NOTE — Discharge Instructions (Addendum)
Thank you for allowing me to be part of your care today.  You were evaluated in the ED and your wound was rechecked.  Your x-ray did not show evidence of a gas-forming infection, this is reassuring.  The substance on your gauze and splint is likely slough of skin from moisture getting into your splint.  It is important for you to do your best to try and keep your splint dry to avoid this in the future.  You should not remove the splint until you see orthopedics on Tuesday.  I am sending you home on a short course of pain medicine to help with severe pain.  I encourage you to still use naproxen or ibuprofen as needed for mild to moderate pain.  Continue to take your antibiotic as prescribed and follow-up with orthopedics.    Return to the ED if you develop sudden worsening of your symptoms or if you have any new concerns.

## 2023-05-23 NOTE — ED Notes (Addendum)
Ortho tech contacted advised it would be approximately to an hour before she arrives

## 2023-05-23 NOTE — ED Provider Notes (Signed)
[place] EMERGENCY DEPARTMENT AT [facility] Provider Note   CSN: [id number] Arrival date & time: 05/23/23  1222     History  Chief Complaint  Patient presents with   Hand Pain   Hand Injury    Samantha Fowler is a 32 y.o. female returns to the ED with concerns of infection of her hand laceration.  Patient was seen 3 days prior for hand laceration and fracture.  Patient has been taking naproxen without relief of pain.  She has also been taking Keflex as prescribed.  Patient states that she has been wrapping her splint to keep it dry, but noticed yesterday that there is questionably is purulent drainage with a foul odor.  Denies fever, redness, increased warmth, swelling.       Home Medications Prior to Admission medications   Medication Sig Start Date End Date Taking? Authorizing Provider  cephALEXin (KEFLEX) 500 MG capsule Take 1 capsule (500 mg total) by mouth 4 (four) times daily. 05/20/23   Molpus, Jonny Ruiz, MD  cetirizine (ZYRTEC ALLERGY) 10 MG tablet Take 1 tablet (10 mg total) by mouth at bedtime. 10/13/22 04/11/23  Theadora Rama Scales, PA-C  ferrous sulfate 325 (65 FE) MG tablet Take 1 tablet (325 mg total) by mouth daily. 10/15/22   Merrilee Jansky, MD  fluconazole (DIFLUCAN) 150 MG tablet Take 1 tablet as needed for vaginal yeast infection.  May repeat in 3 days if symptoms persist. 05/20/23   Molpus, Jonny Ruiz, MD  fluticasone (FLONASE) 50 MCG/ACT nasal spray Place 1 spray into both nostrils daily. Begin by using 2 sprays in each nare daily for 3 to 5 days, then decrease to 1 spray in each nare daily. 10/13/22   Theadora Rama Scales, PA-C  naproxen (NAPROSYN) 375 MG tablet Take 1 tablet twice daily as needed for pain. 05/20/23   Molpus, Jonny Ruiz, MD      Allergies    Patient has no known allergies.    Review of Systems   Review of Systems  Constitutional:  Negative for fever.  Musculoskeletal:  Positive for arthralgias (right hand pain). Negative for joint  swelling.  Skin:  Positive for wound (concern for infection). Negative for color change.    Physical Exam Updated Vital Signs BP (!) 144/103 (BP Location: Left Arm)   Pulse 72   Temp 97.9 F (36.6 C) (Oral)   Resp 18   Ht 5\' 5"  (1.651 m)   Wt 62.1 kg   LMP 05/19/2023   SpO2 100%   BMI 22.80 kg/m  Physical Exam  ED Results / Procedures / Treatments   Labs (all labs ordered are listed, but only abnormal results are displayed) Labs Reviewed - No data to display  EKG None  Radiology DG Hand Complete Right  Result Date: 05/23/2023 CLINICAL DATA:  Right hand laceration.  Concern for infection EXAM: RIGHT HAND - COMPLETE 3+ VIEW COMPARISON:  05/19/2023 FINDINGS: Mildly displaced and angulated fracture of the fifth metacarpal neck, unchanged. No bridging callus formation at this time. No new fracture. No dislocation. No erosions. Joint spaces are preserved. Mild soft tissue swelling of the hand. No soft tissue gas. IMPRESSION: 1. Mildly displaced and angulated fracture of the fifth metacarpal neck, unchanged. No bridging callus formation at this time. 2. Mild soft tissue swelling without soft tissue gas. Electronically Signed   By: Duanne Guess D.O.   On: 05/23/2023 13:19    Procedures Procedures    Medications Ordered in ED Medications  HYDROcodone-acetaminophen (NORCO/VICODIN) 5-325 MG per  tablet 1 tablet (1 tablet Oral Given 05/23/23 1308)  oxyCODONE (Oxy IR/ROXICODONE) immediate release tablet 5 mg (5 mg Oral Given 05/23/23 1340)    ED Course/ Medical Decision Making/ A&P                             Medical Decision Making Amount and/or Complexity of Data Reviewed Radiology: ordered.  Risk Prescription drug management.   This patient presents to the ED with chief complaint(s) of concerns for infection from hand wound with pertinent past medical history of fracture to the distal fifth metacarpal.  The complaint involves an extensive differential diagnosis and also  carries with it a high risk of complications and morbidity.    The differential diagnosis includes cellulitis, necrotizing fasciitis, osteomyelitis  Initial Assessment:   Exam significant for mild swelling over the fourth and fifth fingers of the right hand.  3 sutures identified and are intact.  Skin appears very moist.  No foul odor or purulent drainage.  Gauze that was wrapped around hand with small area of pale yellow slough and old blood.  No active bleeding.    Treatment and Reassessment: Will clean up dried blood and wound and re-splint.  Suspect that drainage was slough and serosanguinous as there is no odor or active drainage from the site.  Suspect moisture was trapped under splint based on findings.  Ortho tech to come and place new splint.    Splint was placed without incident.    Disposition:   Will send patient home with short course of pain medicine to use for severe pain.  Advised patient to continue to use naproxen for mild to moderate pain.  Discussed splint care with patient.  The patient has been appropriately medically screened and/or stabilized in the ED. I have low suspicion for any other emergent medical condition which would require further screening, evaluation or treatment in the ED or require inpatient management. At time of discharge the patient is hemodynamically stable and in no acute distress. I have discussed work-up results and diagnosis with patient and answered all questions. Patient is agreeable with discharge plan. We discussed strict return precautions for returning to the emergency department and they verbalized understanding.            Final Clinical Impression(s) / ED Diagnoses Final diagnoses:  Encounter for wound re-check    Rx / DC Orders ED Discharge Orders     None         Lenard Simmer, PA-C 05/23/23 1453    Pricilla Loveless, MD 05/23/23 [phone]

## 2023-05-23 NOTE — ED Triage Notes (Signed)
Patient reports she was evaluated for hand lac and fracture right hand Sent home with diflucan, keflex and aleve. Patient reports she believes her hand is infected now because there is a green purulent drainage with foul odor Patient reports also that the pain is too intense for aleve and she needs pain medications  Pain rated in triage 10/10

## 2023-10-03 ENCOUNTER — Emergency Department (HOSPITAL_COMMUNITY)
Admission: EM | Admit: 2023-10-03 | Discharge: 2023-10-03 | Discharge status: Against Medical Advice | Payer: BC Managed Care – PPO | Source: Home / Self Care

## 2023-12-08 IMAGING — CR DG RIBS W/ CHEST 3+V*R*
3 series · 3 of 3 positions shown · non-contrast
Comparison: None.

CLINICAL DATA: Right rib pain.  No known injury.

EXAM:
RIGHT RIBS AND CHEST - 3+ VIEW

[w chest pa]
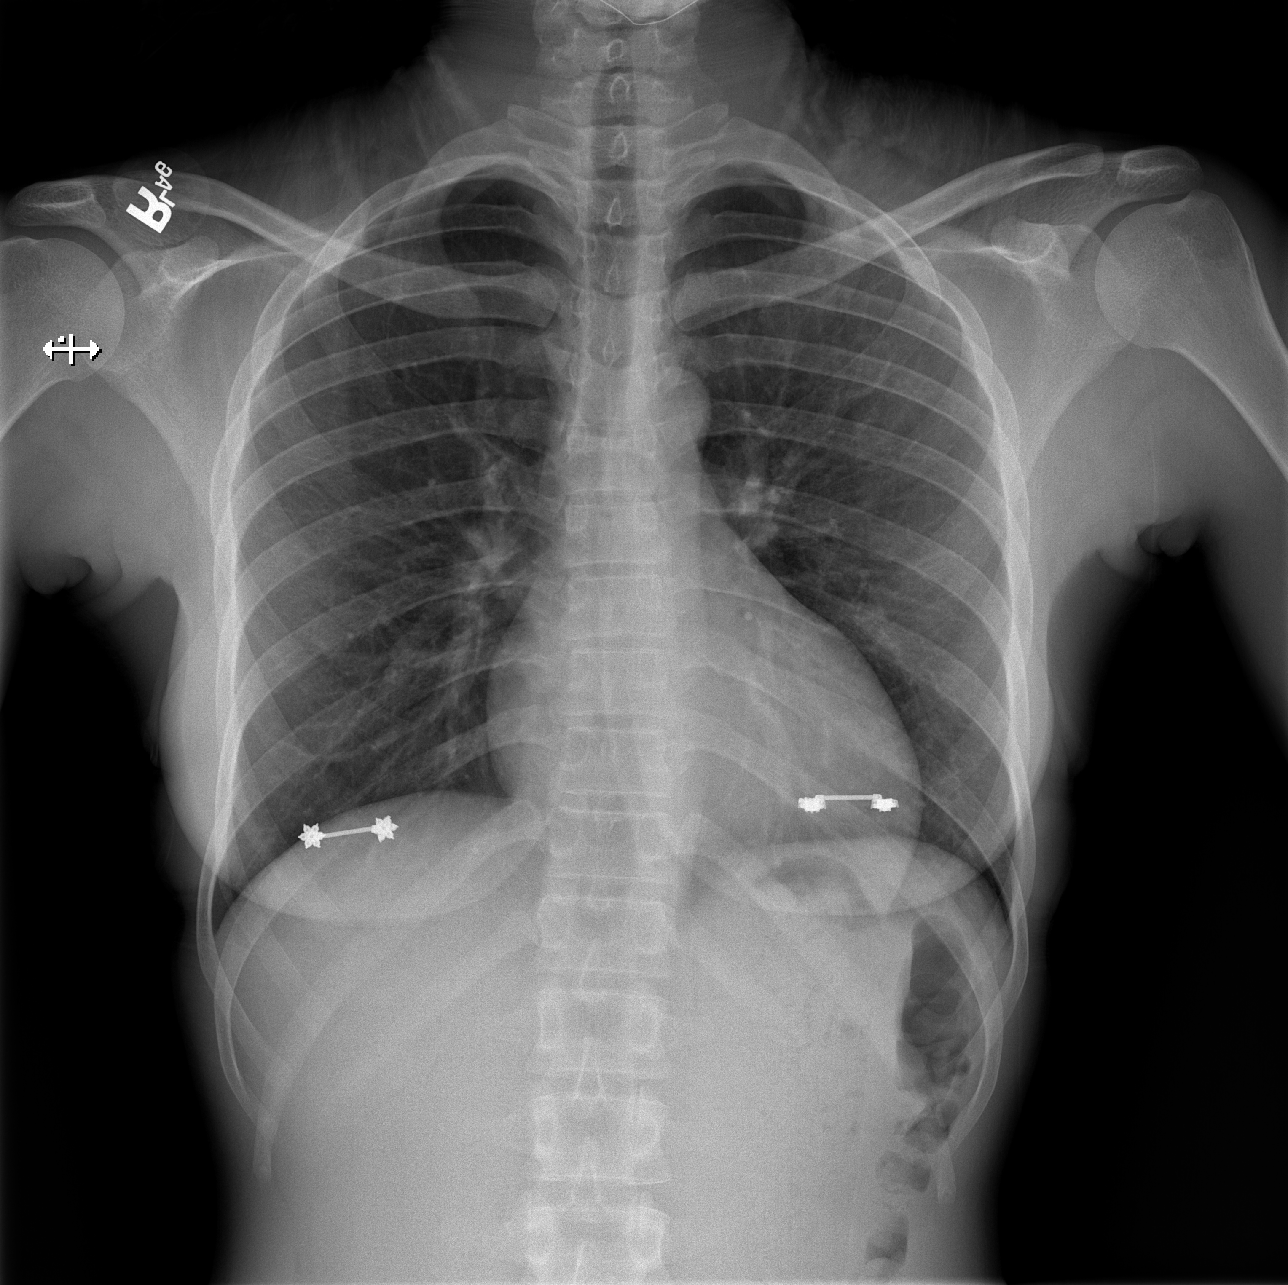

[w ribs obl right (1 of 2)]
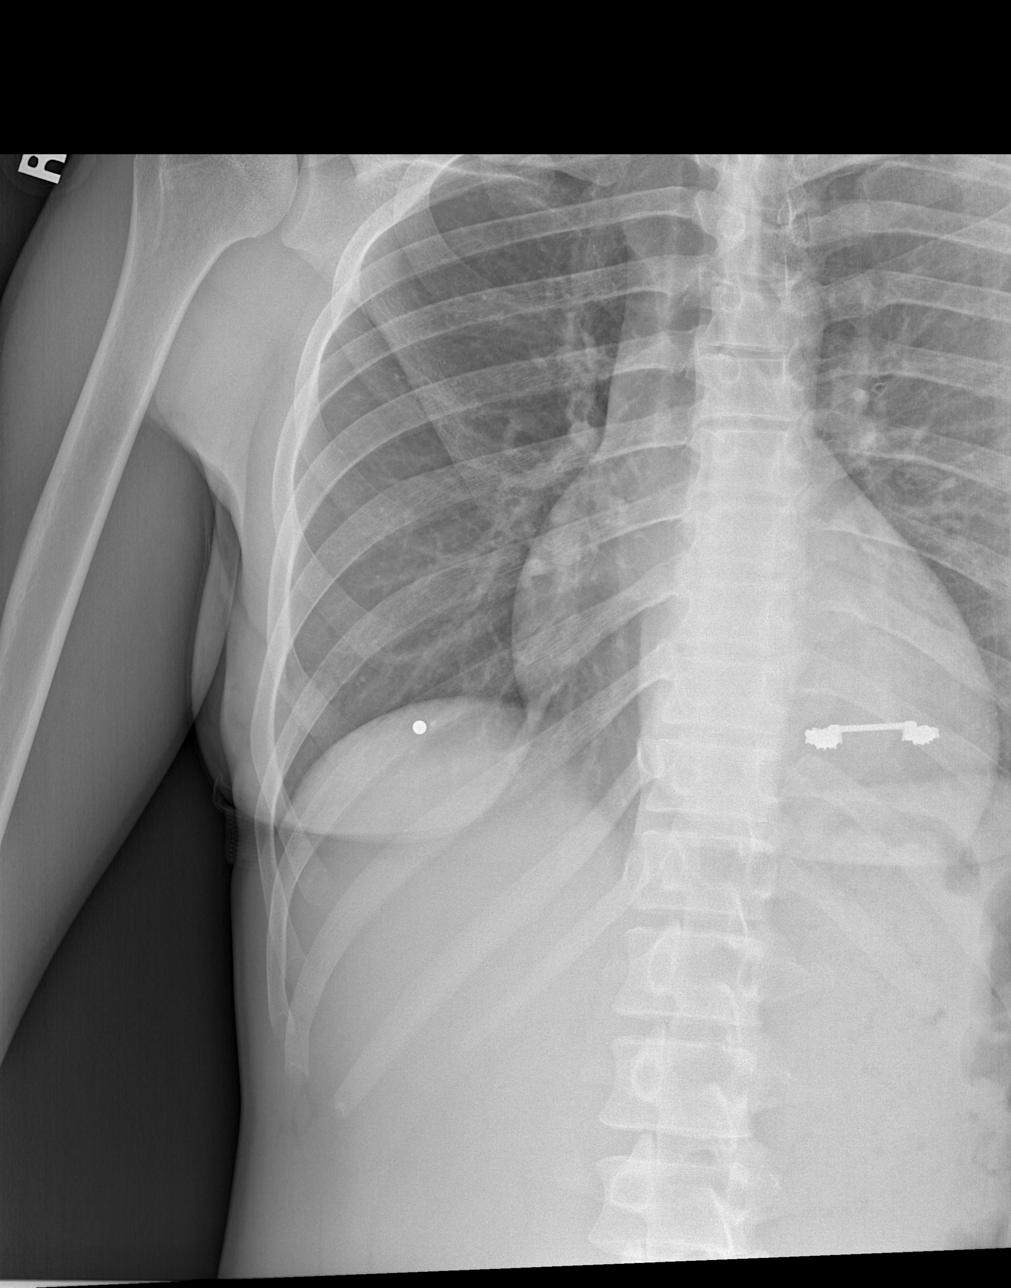

[w ribs obl right (2 of 2)]
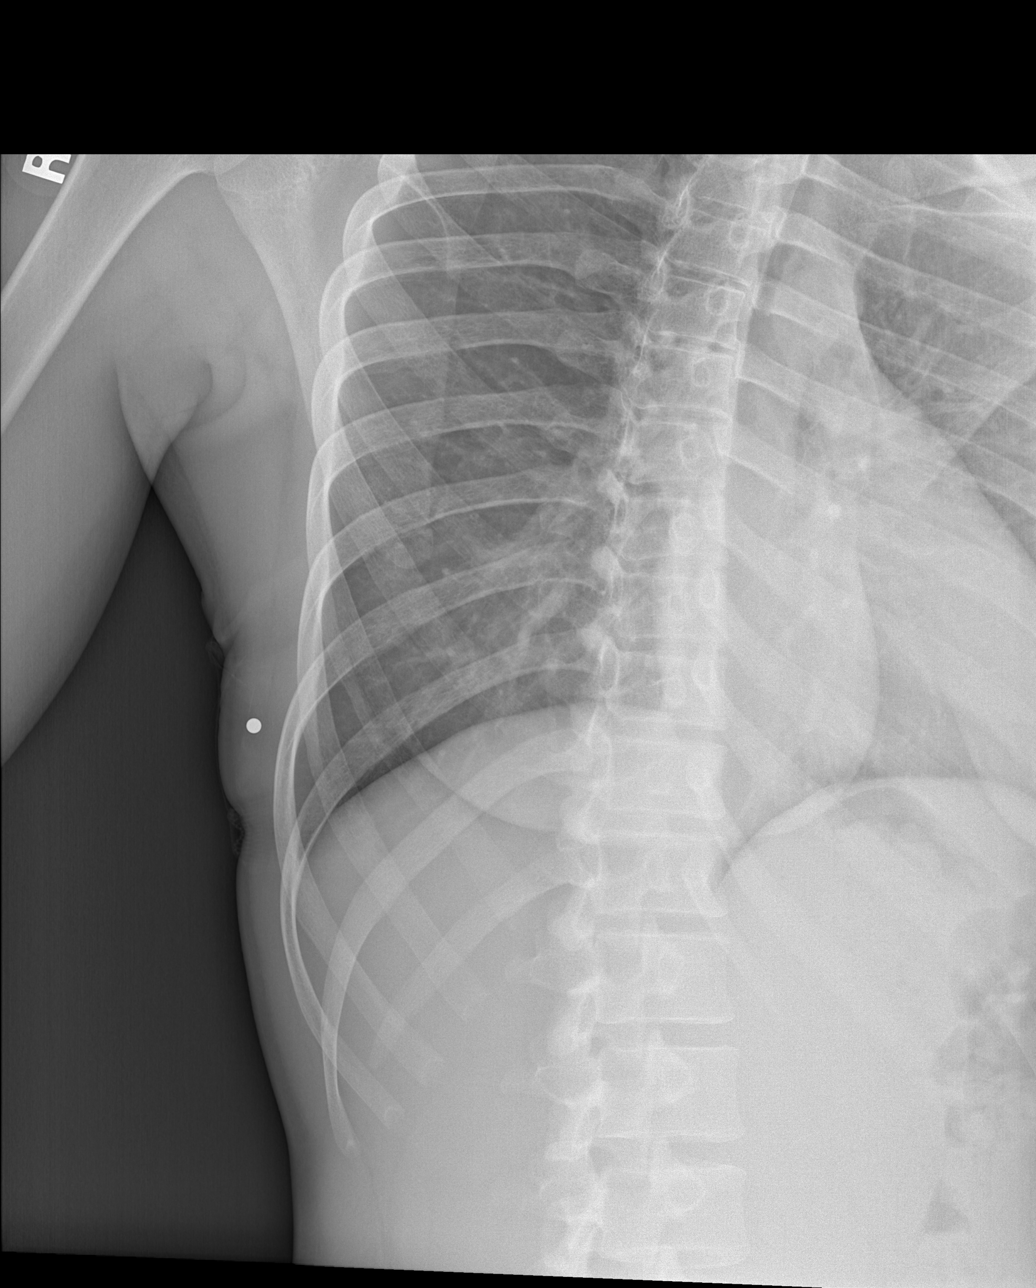

[3 of 3 positions shown; findings below may reference images not displayed]

FINDINGS: No fracture or other bone lesions are seen involving the ribs. There
is no evidence of pneumothorax or pleural effusion. Both lungs are
clear. Heart size and mediastinal contours are within normal limits.
IMPRESSION: Negative.

## 2024-08-24 ENCOUNTER — Emergency Department (HOSPITAL_COMMUNITY)

## 2024-08-24 ENCOUNTER — Other Ambulatory Visit: Payer: Self-pay

## 2024-08-24 ENCOUNTER — Emergency Department (HOSPITAL_COMMUNITY)
Admission: EM | Admit: 2024-08-24 | Discharge: 2024-08-24 | Disposition: A | Discharge status: Home / Self Care | Attending: Emergency Medicine | Admitting: Emergency Medicine

## 2024-08-24 DIAGNOSIS — K7581 Nonalcoholic steatohepatitis (NASH): Secondary | ICD-10-CM | POA: Diagnosis not present

## 2024-08-24 DIAGNOSIS — J45909 Unspecified asthma, uncomplicated: Secondary | ICD-10-CM | POA: Insufficient documentation

## 2024-08-24 DIAGNOSIS — Z7951 Long term (current) use of inhaled steroids: Secondary | ICD-10-CM | POA: Diagnosis not present

## 2024-08-24 DIAGNOSIS — K529 Noninfective gastroenteritis and colitis, unspecified: Secondary | ICD-10-CM | POA: Insufficient documentation

## 2024-08-24 DIAGNOSIS — E860 Dehydration: Secondary | ICD-10-CM | POA: Insufficient documentation

## 2024-08-24 DIAGNOSIS — R1110 Vomiting, unspecified: Secondary | ICD-10-CM | POA: Diagnosis present

## 2024-08-24 LAB — URINALYSIS, ROUTINE W REFLEX MICROSCOPIC
Bilirubin Urine: NEGATIVE
Glucose, UA: NEGATIVE mg/dL
Hgb urine dipstick: NEGATIVE
Ketones, ur: 5 mg/dL — AB
Nitrite: NEGATIVE
Protein, ur: 30 mg/dL — AB
Specific Gravity, Urine: 1.028 (ref 1.005–1.030)
pH: 5 (ref 5.0–8.0)

## 2024-08-24 LAB — CBC WITH DIFFERENTIAL/PLATELET
Abs Immature Granulocytes: 0.02 K/uL (ref 0.00–0.07)
Basophils Absolute: 0 K/uL (ref 0.0–0.1)
Basophils Relative: 1 %
Eosinophils Absolute: 0 K/uL (ref 0.0–0.5)
Eosinophils Relative: 0 %
HCT: 26.5 % — ABNORMAL LOW (ref 36.0–46.0)
Hemoglobin: 7.4 g/dL — ABNORMAL LOW (ref 12.0–15.0)
Immature Granulocytes: 0 %
Lymphocytes Relative: 21 %
Lymphs Abs: 1.4 K/uL (ref 0.7–4.0)
MCH: 22 pg — ABNORMAL LOW (ref 26.0–34.0)
MCHC: 27.9 g/dL — ABNORMAL LOW (ref 30.0–36.0)
MCV: 78.6 fL — ABNORMAL LOW (ref 80.0–100.0)
Monocytes Absolute: 0.3 K/uL (ref 0.1–1.0)
Monocytes Relative: 5 %
Neutro Abs: 4.6 K/uL (ref 1.7–7.7)
Neutrophils Relative %: 73 %
Platelets: 268 K/uL (ref 150–400)
RBC: 3.37 MIL/uL — ABNORMAL LOW (ref 3.87–5.11)
RDW: 22.5 % — ABNORMAL HIGH (ref 11.5–15.5)
Smear Review: NORMAL
WBC: 6.4 K/uL (ref 4.0–10.5)
nRBC: 0 % (ref 0.0–0.2)

## 2024-08-24 LAB — COMPREHENSIVE METABOLIC PANEL WITH GFR
ALT: 77 U/L — ABNORMAL HIGH (ref 0–44)
AST: 188 U/L — ABNORMAL HIGH (ref 15–41)
Albumin: 4.4 g/dL (ref 3.5–5.0)
Alkaline Phosphatase: 100 U/L (ref 38–126)
Anion gap: 13 (ref 5–15)
BUN: 8 mg/dL (ref 6–20)
CO2: 21 mmol/L — ABNORMAL LOW (ref 22–32)
Calcium: 9.6 mg/dL (ref 8.9–10.3)
Chloride: 101 mmol/L (ref 98–111)
Creatinine, Ser: 0.99 mg/dL (ref 0.44–1.00)
GFR, Estimated: >60 mL/min (ref >60)
Glucose, Bld: 91 mg/dL (ref 70–99)
Potassium: 3.6 mmol/L (ref 3.5–5.1)
Sodium: 135 mmol/L (ref 135–145)
Total Bilirubin: 1.4 mg/dL — ABNORMAL HIGH (ref 0.0–1.2)
Total Protein: 8.5 g/dL — ABNORMAL HIGH (ref 6.5–8.1)

## 2024-08-24 LAB — RESP PANEL BY RT-PCR (RSV, FLU A&B, COVID)  RVPGX2
Influenza A by PCR: NEGATIVE
Influenza B by PCR: NEGATIVE
Resp Syncytial Virus by PCR: NEGATIVE
SARS Coronavirus 2 by RT PCR: NEGATIVE

## 2024-08-24 LAB — LIPASE, BLOOD: Lipase: 17 U/L (ref 11–51)

## 2024-08-24 LAB — PREGNANCY, URINE: Preg Test, Ur: NEGATIVE

## 2024-08-24 MED ORDER — ONDANSETRON HCL 4 MG/2ML IJ SOLN
4.0000 mg | Freq: Once | INTRAMUSCULAR | Status: AC
  Administered 2024-08-24: 4 mg via INTRAVENOUS
  Filled 2024-08-24: qty 2

## 2024-08-24 MED ORDER — ONDANSETRON HCL 4 MG PO TABS: 4.0000 mg | ORAL_TABLET | Freq: Four times a day (QID) | ORAL | 0 refills | Status: AC

## 2024-08-24 MED ORDER — LOPERAMIDE HCL 2 MG PO CAPS: 2.0000 mg | ORAL_CAPSULE | Freq: Four times a day (QID) | ORAL | 0 refills | Status: AC | PRN

## 2024-08-24 MED ORDER — IOHEXOL 300 MG/ML  SOLN
100.0000 mL | Freq: Once | INTRAMUSCULAR | Status: AC | PRN
  Administered 2024-08-24: 100 mL via INTRAVENOUS

## 2024-08-24 MED ORDER — LACTATED RINGERS IV BOLUS
1000.0000 mL | Freq: Once | INTRAVENOUS | Status: AC
  Administered 2024-08-24: 1000 mL via INTRAVENOUS

## 2024-08-24 MED ORDER — LOPERAMIDE HCL 2 MG PO CAPS
2.0000 mg | ORAL_CAPSULE | Freq: Once | ORAL | Status: AC
  Administered 2024-08-24: 2 mg via ORAL
  Filled 2024-08-24: qty 1

## 2024-08-24 NOTE — Discharge Instructions (Addendum)
 While you are in the emergency room, you had blood work done that did show some elevation in your liver function test.  You are to CT scan that showed you have some fatty infiltration of your liver, or NASH.  I have included the telephone number for a GI doctor.  Please give them a call this week to set up a follow-up appointment.  It may be several months before they are able to see you.  Please follow-up with your primary care doctor within 1 to 2 weeks to discuss your symptoms.  I have sent a prescription for Zofran  and Imodium  for you to help out with your nausea and diarrhea.  Drink plenty of water.  Symptoms should begin to improve over the next 1 week.

## 2024-08-24 NOTE — ED Provider Notes (Signed)
 [place] EMERGENCY DEPARTMENT AT [facility] Provider Note  CSN: [id number] Arrival date & time: 08/24/24 1847  Chief Complaint(s) Emesis and Diarrhea  HPI Samantha Fowler is a 33 y.o. female is here today for 1 week of vomiting, diarrhea and chills.  States that she has been having cramps, feels as though she is dehydrated.   Past Medical History Past Medical History:  Diagnosis Date   Anemia    Asthma    Endometriosis    Patient Active Problem List   Diagnosis Date Noted   PID (acute pelvic inflammatory disease) 04/10/2018   Chlamydia 04/09/2018   TOA (tubo-ovarian abscess) 04/08/2018   Home Medication(s) Prior to Admission medications   Medication Sig Start Date End Date Taking? Authorizing Provider  loperamide  (IMODIUM ) 2 MG capsule Take 1 capsule (2 mg total) by mouth 4 (four) times daily as needed for diarrhea or loose stools. 08/24/24  Yes Mannie Pac T, DO  ondansetron  (ZOFRAN ) 4 MG tablet Take 1 tablet (4 mg total) by mouth every 6 (six) hours. 08/24/24  Yes Mannie Pac T, DO  cephALEXin  (KEFLEX ) 500 MG capsule Take 1 capsule (500 mg total) by mouth 4 (four) times daily. 05/20/23   Molpus, John, MD  cetirizine  (ZYRTEC  ALLERGY) 10 MG tablet Take 1 tablet (10 mg total) by mouth at bedtime. 10/13/22 04/11/23  Joesph Shaver Scales, PA-C  ferrous sulfate  325 (65 FE) MG tablet Take 1 tablet (325 mg total) by mouth daily. 10/15/22   LampteyAleene KIDD, MD  fluconazole  (DIFLUCAN ) 150 MG tablet Take 1 tablet as needed for vaginal yeast infection.  May repeat in 3 days if symptoms persist. 05/20/23   Molpus, Norleen, MD  fluticasone  (FLONASE ) 50 MCG/ACT nasal spray Place 1 spray into both nostrils daily. Begin by using 2 sprays in each nare daily for 3 to 5 days, then decrease to 1 spray in each nare daily. 10/13/22   Joesph Shaver Scales, PA-C  naproxen  (NAPROSYN ) 375 MG tablet Take 1 tablet twice daily as needed for pain. 05/20/23   Molpus, John, MD  oxyCODONE   (ROXICODONE ) 5 MG immediate release tablet Take 1 tablet (5 mg total) by mouth every 6 (six) hours as needed for severe pain. 05/23/23   Gretta Gerard SAUNDERS, PA-C                                                                                                                                    Past Surgical History No past surgical history on file. Family History No family history on file.  Social History Social History   Tobacco Use   Smoking status: Never   Smokeless tobacco: Never  Vaping Use   Vaping status: Never Used  Substance Use Topics   Alcohol use: Yes   Drug use: Never   Allergies Patient has no known allergies.  Review of Systems Review of Systems  Physical Exam Vital Signs  I have reviewed  the triage vital signs BP (!) 143/84   Pulse 80   Temp 98.6 F (37 C) (Oral)   Resp 16   Ht 5' 4 (1.626 m)   Wt 62.1 kg   LMP 07/01/2024 (Exact Date)   SpO2 100%   BMI 23.52 kg/m   Physical Exam Vitals and nursing note reviewed.  HENT:     Head: Normocephalic.  Cardiovascular:     Rate and Rhythm: Normal rate.  Pulmonary:     Effort: Pulmonary effort is normal.  Abdominal:     General: Abdomen is flat. There is no distension.     Palpations: Abdomen is soft. There is no mass.     Tenderness: There is no abdominal tenderness. There is no guarding.  Musculoskeletal:     Cervical back: Normal range of motion.  Neurological:     Mental Status: She is alert.     ED Results and Treatments Labs (all labs ordered are listed, but only abnormal results are displayed) Labs Reviewed  CBC WITH DIFFERENTIAL/PLATELET - Abnormal; Notable for the following components:      Result Value   RBC 3.37 (*)    Hemoglobin 7.4 (*)    HCT 26.5 (*)    MCV 78.6 (*)    MCH 22.0 (*)    MCHC 27.9 (*)    RDW 22.5 (*)    All other components within normal limits  COMPREHENSIVE METABOLIC PANEL WITH GFR - Abnormal; Notable for the following components:   CO2 21 (*)    Total Protein  8.5 (*)    AST 188 (*)    ALT 77 (*)    Total Bilirubin 1.4 (*)    All other components within normal limits  URINALYSIS, ROUTINE W REFLEX MICROSCOPIC - Abnormal; Notable for the following components:   Color, Urine AMBER (*)    APPearance CLOUDY (*)    Ketones, ur 5 (*)    Protein, ur 30 (*)    Leukocytes,Ua TRACE (*)    Bacteria, UA RARE (*)    All other components within normal limits  RESP PANEL BY RT-PCR (RSV, FLU A&B, COVID)  RVPGX2  LIPASE, BLOOD  PREGNANCY, URINE  HEPATITIS PANEL, ACUTE  MONONUCLEOSIS SCREEN                                                                                                                          Radiology CT ABDOMEN PELVIS W CONTRAST Result Date: 08/24/2024 CLINICAL DATA:  Acute abdominal pain. EXAM: CT ABDOMEN AND PELVIS WITH CONTRAST TECHNIQUE: Multidetector CT imaging of the abdomen and pelvis was performed using the standard protocol following bolus administration of intravenous contrast. RADIATION DOSE REDUCTION: This exam was performed according to the departmental dose-optimization program which includes automated exposure control, adjustment of the mA and/or kV according to patient size and/or use of iterative reconstruction technique. CONTRAST:  OMNIPAQUE  IOHEXOL  300 MG/ML  SOLN COMPARISON:  CT abdomen and pelvis 04/08/2018 FINDINGS: Lower chest: No acute  abnormality. Hepatobiliary: There is diffuse fatty infiltration of the liver. No focal liver lesions are seen. Gallbladder and bile ducts are within normal limits. Pancreas: Unremarkable. No pancreatic ductal dilatation or surrounding inflammatory changes. Spleen: Normal in size without focal abnormality. Adrenals/Urinary Tract: Adrenal glands are unremarkable. Kidneys are normal, without renal calculi, focal lesion, or hydronephrosis. Bladder is unremarkable. Stomach/Bowel: Stomach is within normal limits. Appendix appears normal. No evidence of bowel wall thickening, distention, or  inflammatory changes. Vascular/Lymphatic: Aorta and IVC are normal in size. There are nonenlarged central mesenteric lymph nodes. Reproductive: There is endometrial fluid or thickening. Bilateral adnexa are within normal limits. Other: There is trace free fluid in the pelvis. There is no focal abdominal wall hernia. Musculoskeletal: No fracture is seen. IMPRESSION: 1. No acute localizing process in the abdomen or pelvis. 2. Trace free fluid in the pelvis is likely physiologic. 3. Endometrial fluid or thickening. Correlate with menstrual cycle. Consider follow-up ultrasound. 4. Fatty infiltration of the liver. Electronically Signed   By: Greig Pique M.D.   On: 08/24/2024 22:48   DG Chest Portable 1 View Result Date: 08/24/2024 CLINICAL DATA:  Cough, vomiting, diarrhea, and chills. EXAM: PORTABLE CHEST 1 VIEW COMPARISON:  11/12/2021 FINDINGS: The heart size and mediastinal contours are within normal limits. Both lungs are clear. The visualized skeletal structures are unremarkable. IMPRESSION: No active disease. Electronically Signed   By: Elsie Gravely M.D.   On: 08/24/2024 21:33    Pertinent labs & imaging results that were available during my care of the patient were reviewed by me and considered in my medical decision making (see MDM for details).  Medications Ordered in ED Medications  lactated ringers  bolus 1,000 mL (1,000 mLs Intravenous New Bag/Given 08/24/24 2204)  ondansetron  (ZOFRAN ) injection 4 mg (4 mg Intravenous Given 08/24/24 2203)  iohexol  (OMNIPAQUE ) 300 MG/ML solution 100 mL (100 mLs Intravenous Contrast Given 08/24/24 2232)  loperamide  (IMODIUM ) capsule 2 mg (2 mg Oral Given 08/24/24 2305)                                                                                                                                     Procedures Procedures  (including critical care time)  Medical Decision Making / ED Course   This patient presents to the ED for concern of nausea, vomiting,  diarrhea, this involves an extensive number of treatment options, and is a complaint that carries with it a high risk of complications and morbidity.  The differential diagnosis includes enteritis, gastroenteritis, intra-abdominal infection, viral syndrome.  MDM: Patient overall well-appearing.  She has normal vital signs.  She has no tenderness in her abdomen pelvis on physical exam.  Patient had labs drawn at triage which do show modest elevations in her LFTs.  Asked patient about alcohol use, she denies drinking.  Denies drug use.  Will add in Monospot, hepatitis panel.  Will obtain imaging of the patient's abdomen given lab  abnormalities.  Fluids and Zofran  ordered.  Reassessment 10:55 PM-no acute abnormalities in the patient's CT imaging.  Question of some thickening of the endometrium, patient about to have her menstrual cycle.  No suprapubic pain.  Patient with fatty infiltration of the liver, likely contributing to her elevation in LFTs.  Discussed this with the patient, provided GI referral.  Anemia stable.  Will discharge patient with prescriptions for Zofran , Imodium .   Additional history obtained: -Additional history obtained from  -External records from outside source obtained and reviewed including: Chart review including previous notes, labs, imaging, consultation notes   Lab Tests: -I ordered, reviewed, and interpreted labs.   The pertinent results include:   Labs Reviewed  CBC WITH DIFFERENTIAL/PLATELET - Abnormal; Notable for the following components:      Result Value   RBC 3.37 (*)    Hemoglobin 7.4 (*)    HCT 26.5 (*)    MCV 78.6 (*)    MCH 22.0 (*)    MCHC 27.9 (*)    RDW 22.5 (*)    All other components within normal limits  COMPREHENSIVE METABOLIC PANEL WITH GFR - Abnormal; Notable for the following components:   CO2 21 (*)    Total Protein 8.5 (*)    AST 188 (*)    ALT 77 (*)    Total Bilirubin 1.4 (*)    All other components within normal limits   URINALYSIS, ROUTINE W REFLEX MICROSCOPIC - Abnormal; Notable for the following components:   Color, Urine AMBER (*)    APPearance CLOUDY (*)    Ketones, ur 5 (*)    Protein, ur 30 (*)    Leukocytes,Ua TRACE (*)    Bacteria, UA RARE (*)    All other components within normal limits  RESP PANEL BY RT-PCR (RSV, FLU A&B, COVID)  RVPGX2  LIPASE, BLOOD  PREGNANCY, URINE  HEPATITIS PANEL, ACUTE  MONONUCLEOSIS SCREEN     Imaging Studies ordered: I ordered imaging studies including CT abdomen pelvis I independently visualized and interpreted imaging. I agree with the radiologist interpretation   Medicines ordered and prescription drug management: Meds ordered this encounter  Medications   lactated ringers  bolus 1,000 mL   ondansetron  (ZOFRAN ) injection 4 mg   iohexol  (OMNIPAQUE ) 300 MG/ML solution 100 mL   loperamide  (IMODIUM ) capsule 2 mg   loperamide  (IMODIUM ) 2 MG capsule    Sig: Take 1 capsule (2 mg total) by mouth 4 (four) times daily as needed for diarrhea or loose stools.    Dispense:  12 capsule    Refill:  0   ondansetron  (ZOFRAN ) 4 MG tablet    Sig: Take 1 tablet (4 mg total) by mouth every 6 (six) hours.    Dispense:  12 tablet    Refill:  0    -I have reviewed the patients home medicines and have made adjustments as needed  Cardiac Monitoring: The patient was maintained on a cardiac monitor.  I personally viewed and interpreted the cardiac monitored which showed an underlying rhythm of: Sinus rhythm  Social Determinants of Health:  Factors impacting patients care include: Lack of access to primary care   Reevaluation: After the interventions noted above, I reevaluated the patient and found that they have :improved  Co morbidities that complicate the patient evaluation  Past Medical History:  Diagnosis Date   Anemia    Asthma    Endometriosis       Dispostion: I considered admission for this patient, however with her workup she  is appropriate for  discharge.     Final Clinical Impression(s) / ED Diagnoses Final diagnoses:  Dehydration  Gastroenteritis  NASH (nonalcoholic steatohepatitis)     @PCDICTATION @    Mannie Pac T, DO 08/24/24 2318

## 2024-08-24 NOTE — ED Triage Notes (Signed)
 PT ambulatory to triage with complaints of vomiting, diarrhea, and chills. Pt is now having cramps all over, and thinks that she is dehydrated. PT states that she cannot keep down food or liquids.   LMP: 07/01/24  Vomiting X2 today. Diarrhea X2 today

## 2024-08-25 LAB — HEPATITIS PANEL, ACUTE
HCV Ab: NONREACTIVE
Hep A IgM: NONREACTIVE
Hep B C IgM: NONREACTIVE
Hepatitis B Surface Ag: NONREACTIVE

## 2024-08-25 LAB — MONONUCLEOSIS SCREEN: Mono Screen: NEGATIVE
